# Patient Record
Sex: Female | Born: 1975 | Race: White | Hispanic: No | Marital: Married | Smoking: Current every day smoker
Health system: Southern US, Community
[De-identification: ages and names within clinical notes are randomized; demographics above are authoritative.]

## PROBLEM LIST (undated history)

## (undated) HISTORY — PX: TOOTH EXTRACTION: SUR596

---

## 2018-03-05 ENCOUNTER — Other Ambulatory Visit: Payer: Self-pay

## 2018-03-05 ENCOUNTER — Emergency Department (HOSPITAL_COMMUNITY): Payer: Self-pay

## 2018-03-05 ENCOUNTER — Encounter (HOSPITAL_COMMUNITY): Payer: Self-pay | Admitting: Emergency Medicine

## 2018-03-05 ENCOUNTER — Emergency Department (HOSPITAL_COMMUNITY)
Admission: EM | Admit: 2018-03-05 | Discharge: 2018-03-05 | Disposition: A | Discharge status: Home / Self Care | Payer: Self-pay | Attending: Emergency Medicine | Admitting: Emergency Medicine

## 2018-03-05 DIAGNOSIS — Y939 Activity, unspecified: Secondary | ICD-10-CM | POA: Insufficient documentation

## 2018-03-05 DIAGNOSIS — W19XXXA Unspecified fall, initial encounter: Secondary | ICD-10-CM | POA: Insufficient documentation

## 2018-03-05 DIAGNOSIS — S82831A Other fracture of upper and lower end of right fibula, initial encounter for closed fracture: Secondary | ICD-10-CM | POA: Insufficient documentation

## 2018-03-05 DIAGNOSIS — Y999 Unspecified external cause status: Secondary | ICD-10-CM | POA: Insufficient documentation

## 2018-03-05 DIAGNOSIS — F17210 Nicotine dependence, cigarettes, uncomplicated: Secondary | ICD-10-CM | POA: Insufficient documentation

## 2018-03-05 DIAGNOSIS — S82301A Unspecified fracture of lower end of right tibia, initial encounter for closed fracture: Secondary | ICD-10-CM | POA: Insufficient documentation

## 2018-03-05 DIAGNOSIS — Y92019 Unspecified place in single-family (private) house as the place of occurrence of the external cause: Secondary | ICD-10-CM | POA: Insufficient documentation

## 2018-03-05 MED ORDER — OXYCODONE-ACETAMINOPHEN 5-325 MG PO TABS
1.0000 | ORAL_TABLET | Freq: Once | ORAL | Status: AC
  Administered 2018-03-05: 1 via ORAL
  Filled 2018-03-05: qty 1

## 2018-03-05 MED ORDER — OXYCODONE-ACETAMINOPHEN 5-325 MG PO TABS: 1.0000 | ORAL_TABLET | ORAL | 0 refills | Status: DC | PRN

## 2018-03-05 NOTE — ED Provider Notes (Signed)
[facility]ENCY [facility]PARTMENT Provider Note   CSN: [id number] Arrival date & time: 03/05/18  1413     History   Chief Complaint Chief Complaint  Patient presents with  . Ankle Pain    Right    HPI Julienne KassChristina Vandall is a 42 y.o. female.  The history is provided by the patient. No language interpreter was used.  Ankle Pain   The incident occurred less than 1 hour ago. The incident occurred at home. The injury mechanism was a fall. The pain is present in the right ankle. The quality of the pain is described as sharp and aching. The pain is moderate. The pain has been constant since onset. Associated symptoms include inability to bear weight. She reports no foreign bodies present. She has tried nothing for the symptoms. The treatment provided no relief.    History reviewed. No pertinent past medical history.  There are no active problems to display for this patient.   History reviewed. No pertinent surgical history.   OB History   None      Home Medications    Prior to Admission medications   Not on File    Family History History reviewed. No pertinent family history.  Social History Social History   Tobacco Use  . Smoking status: Current Every Day Smoker    Packs/day: 0.50  . Smokeless tobacco: Never Used  Substance Use Topics  . Alcohol use: Yes    Comment: couple times a week  . Drug use: Not Currently     Allergies   Patient has no known allergies.   Review of Systems Review of Systems  Musculoskeletal: Positive for joint swelling.  All other systems reviewed and are negative.    Physical Exam Updated Vital Signs BP (!) 114/57 (BP Location: Right Arm)   Pulse (!) 113   Temp 98.4 F (36.9 C) (Oral)   Resp 18   Ht 5\' 3"  (1.6 m)   Wt 65.8 kg   LMP 02/13/2018   SpO2 95%   BMI 25.69 kg/m   Physical Exam  Constitutional: She appears well-developed and well-nourished.  HENT:  Head: Normocephalic.  Musculoskeletal: She exhibits  tenderness.  Swollen right ankle  Tender medial and lateral aspect  nv ns intact   Neurological: She is alert.  Skin: Skin is warm.  Psychiatric: She has a normal mood and affect.  Nursing note and vitals reviewed.    ED Treatments / Results  Labs (all labs ordered are listed, but only abnormal results are displayed) Labs Reviewed - No data to display  EKG None  Radiology Dg Ankle Complete Right  Result Date: 03/05/2018 CLINICAL DATA:  Pain after fall EXAM: RIGHT ANKLE - COMPLETE 3+ VIEW COMPARISON:  None. FINDINGS: There is a mildly displaced fracture in the distal fibular diaphysis. There is a fracture in the distal tibia as well involving the posterior malleolus on the lateral view. A lucency through the medial malleolus appears to be more chronic. There is widening of the ankle mortise medially. IMPRESSION: 1. Fracture through the distal fibula. 2. Fracture through the tibia located posteriorly and medially, extending through the posterior malleolus. A lucency through the medial malleolus appears nonacute. 3. Widening of the ankle mortise, particularly medially. Electronically Signed   By: Gerome Samavid  Williams III M.D   On: 03/05/2018 15:13    Procedures Procedures (including critical care time)  Medications Ordered in ED Medications - No data to display   Initial Impression / Assessment and Plan / ED  Course  I have reviewed the triage vital signs and the nursing notes.  Pertinent labs & imaging results that were available during my care of the patient were reviewed by me and considered in my medical decision making (see chart for details).    Xray shows fracture fibula and tibia,   Pt counseled on need to see Dr. Romeo Apple for evaluation.  Pt placed in a splint,  I advised ice,  Rx for Hydrocodone   Final Clinical Impressions(s) / ED Diagnoses   Final diagnoses:  Fracture of distal end of tibia with fibula, right, closed, initial encounter    ED Discharge Orders          Ordered    oxyCODONE-acetaminophen (PERCOCET/ROXICET) 5-325 MG tablet  Every 4 hours PRN     03/05/18 1615        An After Visit Summary was printed and given to the patient.    Osie Cheeks 03/05/18 1616    Donnetta Hutching, MD 03/06/18 [phone]

## 2018-03-05 NOTE — ED Triage Notes (Signed)
Pt reports tripping over dog toys and her ankle went the opposite way. Has ace bandage and crutches with her at this time.

## 2018-03-08 ENCOUNTER — Ambulatory Visit: Payer: Commercial Managed Care - PPO | Admitting: Orthopedic Surgery

## 2018-03-08 ENCOUNTER — Encounter: Payer: Self-pay | Admitting: Orthopedic Surgery

## 2018-03-08 VITALS — BP 135/74 | HR 120 | Ht 63.0 in

## 2018-03-08 DIAGNOSIS — S82899A Other fracture of unspecified lower leg, initial encounter for closed fracture: Secondary | ICD-10-CM

## 2018-03-08 DIAGNOSIS — S82891A Other fracture of right lower leg, initial encounter for closed fracture: Secondary | ICD-10-CM

## 2018-03-08 NOTE — Patient Instructions (Addendum)
Go to [facility] Friday Aug 30th for the CT scan at 7pm, arrive at [facility]6:45

## 2018-03-08 NOTE — Progress Notes (Signed)
  NEW PATIENT OFFICE VISI  Chief Complaint  Patient presents with  . Ankle Injury    rigth ankle injury 03/05/18 tripped over dog     42 year old female healthy fell over some dog twice injured her right ankle.  She was seen in the emergency room complaining of pain and swelling of the right ankle and she could not bear weight.  She had an x-ray which shows a fibular fracture and a posterior malleolar fracture with a disrupted ankle mortise.  Her splint is causing her some discomfort she is having some heel pain.  The pain is resolving somewhat and the splint the splint seems to be causing more pain than the fracture  The pain is now moderate if not mild she still cannot bear weight  Date of injury was August 25   Review of Systems  All other systems reviewed and are negative.    History reviewed. No pertinent past medical history. She says she does not have hypertension or diabetes History reviewed. No pertinent surgical history. Never had surgery History reviewed. No pertinent family history.  No anesthetic problems and no bleeding problems Social History   Tobacco Use  . Smoking status: Current Every Day Smoker    Packs/day: 0.50  . Smokeless tobacco: Never Used  Substance Use Topics  . Alcohol use: Yes    Comment: couple times a week  . Drug use: Not Currently    No Known Allergies  No outpatient medications have been marked as taking for the 03/08/18 encounter (Office Visit) with Vickki HearingHarrison, Faithe Ariola E, MD.    BP 135/74   Pulse (!) 120   Ht 5\' 3"  (1.6 m)   LMP 02/13/2018   BMI 25.69 kg/m   Physical Exam  Constitutional: She is oriented to person, place, and time. She appears well-developed and well-nourished.  Neurological: She is alert and oriented to person, place, and time.  Psychiatric: She has a normal mood and affect. Judgment normal.  Vitals reviewed.   Ortho Exam Left ankle inspection  Normal range of motion normal stability normal muscle tone  strength normal skin normal pulse normal sensation normal  Gait unable to bear weight on the injured limb walks with a supportive device weightbearing on the opposite limb  On the right side we see pain and swelling and tenderness in the medial and lateral side of the ankle she has limited range of motion of the foot does not quite get up to neutral dorsiflexion.  Stability of the ankle is by x-ray unstable muscle tone is normal skin is ecchymotic lateral medial posterior pulse and temperature normal color normal sensation normal   MEDICAL DECISION SECTION  X[facility] were [facility] at [facility]  My independent reading of xrays:  Lateral malleolus fracture supination external rotation type injury large posterior malleolar fragment no obvious medial malleolar fragment, the posterior malleolus seems to be an avulsion type fracture which actually may have more instability associated with it  Encounter Diagnosis  Name Primary?  . Other fracture of unspecified lower leg, initial encounter for closed fracture     PLAN: (Rx., injectx, surgery, frx, mri/ct) Re-SPLINT  CT scan patient would like a knee walker  FU AFTER Ct  SURGERY WILL BE NEEDED   No orders of the defined types were placed in this encounter.   Fuller CanadaStanley Giulia Hickey, MD  03/08/2018 3:12 PM

## 2018-03-10 ENCOUNTER — Ambulatory Visit (HOSPITAL_COMMUNITY)
Admission: RE | Admit: 2018-03-10 | Discharge: 2018-03-10 | Disposition: A | Discharge status: Home / Self Care | Payer: Self-pay | Source: Ambulatory Visit | Attending: Orthopedic Surgery | Admitting: Orthopedic Surgery

## 2018-03-10 DIAGNOSIS — X58XXXA Exposure to other specified factors, initial encounter: Secondary | ICD-10-CM | POA: Insufficient documentation

## 2018-03-10 DIAGNOSIS — S82899A Other fracture of unspecified lower leg, initial encounter for closed fracture: Secondary | ICD-10-CM

## 2018-03-10 DIAGNOSIS — S82851A Displaced trimalleolar fracture of right lower leg, initial encounter for closed fracture: Secondary | ICD-10-CM | POA: Insufficient documentation

## 2018-03-15 ENCOUNTER — Ambulatory Visit: Payer: Commercial Managed Care - PPO | Admitting: Orthopedic Surgery

## 2018-03-15 ENCOUNTER — Encounter: Payer: Self-pay | Admitting: Orthopedic Surgery

## 2018-03-15 VITALS — BP 112/78 | HR 114 | Ht 63.0 in

## 2018-03-15 DIAGNOSIS — S82891D Other fracture of right lower leg, subsequent encounter for closed fracture with routine healing: Secondary | ICD-10-CM

## 2018-03-15 NOTE — Progress Notes (Signed)
Chief Complaint  Patient presents with  . Ankle Pain    right ankle fracture 03/05/18; right ankle pain   . Results    review CT scan    History 42 year old female injured on August 25 sustained a trimalleolar fracture right ankle.  Patient sent for CT scan to further evaluate fracture and help manage surgical plan  CT scan comes back trimalleolar fracture  She will need circumferential fixation with 3 plates she will need supine and prone positioning for fracture fixation  There is a vertical fracture line on the medial side which needs buttress plating there is a posterior avulsion fracture which is indication of fracture dislocation of the ankle which will need buttress plating as well as the fibula which needs plating.  The patient has only mild discomfort more of a dull ache.  9 days of discomfort cannot walk on the fracture denies numbness or tingling  Review of Systems  Constitutional: Negative for chills and fever.  Neurological: Negative for tingling.  All other systems reviewed and are negative.   History reviewed. No pertinent past medical history. History reviewed. No pertinent surgical history. Family History  Problem Relation Age of Onset  . Cancer Mother   . Epilepsy Father   . Cancer Maternal Grandmother    Social History   Tobacco Use  . Smoking status: Current Every Day Smoker    Packs/day: 0.50  . Smokeless tobacco: Never Used  Substance Use Topics  . Alcohol use: Yes    Comment: couple times a week  . Drug use: Not Currently   BP 112/78   Pulse (!) 114   Ht 5\' 3"  (1.6 m)   LMP 02/13/2018   BMI 25.69 kg/m  Physical Exam  Constitutional: She is oriented to person, place, and time. She appears well-developed and well-nourished.  Neurological: She is alert and oriented to person, place, and time.  Psychiatric: She has a normal mood and affect. Judgment normal.  Vitals reviewed.  Left lower extremity no tenderness or malalignment range of motion is  full no instability muscle tone normal neurovascular exam is intact skin normal  Right lower extremity only toes are visible patient in splint does have good capillary refill normal color normal sensation.  She can move her knee and hip normally no instability there.  No palpable tenderness through the splint at this time.  Ankle range of motion limited by splint knee and hip range of motion normal knee and hip stable neurovascular exam otherwise normal in the right lower extremity  Upper extremities no tenderness range of motion normal no instability muscle tone normal neurovascular exam is intact skin is normal  Encounter Diagnosis  Name Primary?  . Closed fracture of right ankle with routine healing, subsequent encounter Yes    Plan ORIF right ankle circumferential fixation start supine interim patient prone  Acumed instruments needed   follow-up 11 for splint change and skin check Patient advised of preop findings risks and benefits explained patient accepts  Patient will follow-up 1 day prior to surgery for skin check surgery plan for September 12

## 2018-03-17 NOTE — Patient Instructions (Signed)
Your procedure is scheduled on: 03/23/2018  Report to [facility] at  7:30   AM.  Call this number if you have problems the morning of surgery: [phone]   Remember:   Do not drink or eat food:After Midnight.  :  Take these medicines the morning of surgery with A SIP OF WATER: oxycodone if needed for pain   Do not wear jewelry, make-up or nail polish.  Do not wear lotions, powders, or perfumes. You may wear deodorant.  Do not shave 48 hours prior to surgery. Men may shave face and neck.  Do not bring valuables to the hospital.  Contacts, dentures or bridgework may not be worn into surgery.  Leave suitcase in the car. After surgery it may be brought to your room.  For patients admitted to the hospital, checkout time is 11:00 AM the day of discharge.   Patients discharged the day of surgery will not be allowed to drive home.    Special Instructions: Shower using CHG night before surgery and shower the day of surgery use CHG.  Use special wash - you have one bottle of CHG for all showers.  You should use approximately 1/2 of the bottle for each shower.  Ankle Fracture Treated With ORIF, Care After Refer to this sheet in the next few weeks. These instructions provide you with information about caring for yourself after your procedure. Your health care provider may also give you more specific instructions. Your treatment has been planned according to current medical practices, but problems sometimes occur. Call your health care provider if you have any problems or questions after your procedure. What can I expect after the procedure? After the procedure, it is common to have:  Pain.  Swelling.  Follow these instructions at home: If you have a cast:  Do not stick anything inside the cast to scratch your skin. Doing that increases your risk of infection.  Check the skin around the cast every day. Report any concerns to your health care provider. You may put lotion on dry skin around  the edges of the cast. Do not apply lotion to the skin underneath the cast.  Do not put pressure on any part of the cast until it is fully hardened. This may take several hours.  Keep the cast clean and dry. If you have a splint or boot:  Wear it as directed by your health care provider. Remove it only as directed by your health care provider.  Loosen the splint or boot if your toes become numb and tingle, or if they turn cold and blue.  Do not pressure on any part of the splint or boot until it is fully hardened. This may take several hours.  Keep the splint or boot clean and dry. Bathing  Do not take baths, swim, or use a hot tub until your health care provider approves. Ask your health care provider if you can take showers. You may only be allowed to take sponge baths for bathing.  If your health care provider approves bathing and showering, cover the cast or splint with a watertight plastic bag to protect it from water. Do not let the cast or splint get wet.  Keep the bandage (dressing) dry until your health care provider says it can be removed. Incision care  There are many ways to close and cover an incision. For example, an incision can be closed with stitches (sutures), skin glue, or adhesive strips. Follow instructions from your health care provider  about: ? How to take care of your incision. ? When and how you should change your bandage (dressing). ? When you should remove your dressing. ? Removing whatever was used to close your incision.  Check your incision area every day for signs of infection. Watch for: ? Redness, swelling, or pain. ? Fluid, blood, or pus. Managing pain, stiffness, and swelling  Move your toes often to avoid stiffness and to lessen swelling.  Raise (elevate) the injured area above the level of your heart while you are sitting or lying down.  If directed, apply ice to the injured area (if you have a splint or a boot, not a cast). ? Put ice in a  plastic bag. ? Place a towel between your skin and the bag. ? Leave the ice on for 20 minutes, 2-3 times per day. Driving  Do not drive or operate heavy machinery while taking pain medicine.  Do not drive while wearing a cast, splint, or boot on a foot that you use for driving. Activity  Return to your normal activities as directed by your health care provider. Ask your health care provider what activities are safe for you.  Perform exercises daily as directed by your health care provider or physical therapist. Safety  Do not walk on the injured ankle and do not use it to support your body weight until your health care provider says that you can. Follow these instructions exactly to prevent problems. Use crutches or other supportive devices as directed by your health care provider. General instructions  Do not use any tobacco products, including cigarettes, chewing tobacco, or e-cigarettes. Tobacco can delay bone healing. If you need help quitting, ask your health care provider.  Take medicines only as directed by your health care provider.  Keep all follow-up visits as directed by your health care provider. This is important. Contact a health care provider if:  You have a fever.  Your pain medicine is not helping.  You have redness, swelling, or pain at the site of your incision.  You have fluid, blood, or pus coming from your incision or seeping through your cast.  You notice a bad smell coming from your incision or your dressing. Get help right away if:  You have chest pain.  You have difficulty breathing.  You have numbness or tingling in your foot or leg.  Your foot becomes cold, pale, or blue. This information is not intended to replace advice given to you by your health care provider. Make sure you discuss any questions you have with your health care provider. Document Released: 01/15/2005 Document Revised: 12/04/2015 Document Reviewed: 05/22/2014 Elsevier  Interactive Patient Education  2018 [employer]. General Anesthesia, Adult, Care After These instructions provide you with information about caring for yourself after your procedure. Your health care provider may also give you more specific instructions. Your treatment has been planned according to current medical practices, but problems sometimes occur. Call your health care provider if you have any problems or questions after your procedure. What can I expect after the procedure? After the procedure, it is common to have:  Vomiting.  A sore throat.  Mental slowness.  It is common to feel:  Nauseous.  Cold or shivery.  Sleepy.  Tired.  Sore or achy, even in parts of your body where you did not have surgery.  Follow these instructions at home: For at least 24 hours after the procedure:  Do not: ? Participate in activities where you could fall or  become injured. ? Drive. ? Use heavy machinery. ? Drink alcohol. ? Take sleeping pills or medicines that cause drowsiness. ? Make important decisions or sign legal documents. ? Take care of children on your own.  Rest. Eating and drinking  If you vomit, drink water, juice, or soup when you can drink without vomiting.  Drink enough fluid to keep your urine clear or pale yellow.  Make sure you have little or no nausea before eating solid foods.  Follow the diet recommended by your health care provider. General instructions  Have a responsible adult stay with you until you are awake and alert.  Return to your normal activities as told by your health care provider. Ask your health care provider what activities are safe for you.  Take over-the-counter and prescription medicines only as told by your health care provider.  If you smoke, do not smoke without supervision.  Keep all follow-up visits as told by your health care provider. This is important. Contact a health care provider if:  You continue to have nausea or  vomiting at home, and medicines are not helpful.  You cannot drink fluids or start eating again.  You cannot urinate after 8-12 hours.  You develop a skin rash.  You have fever.  You have increasing redness at the site of your procedure. Get help right away if:  You have difficulty breathing.  You have chest pain.  You have unexpected bleeding.  You feel that you are having a life-threatening or urgent problem. This information is not intended to replace advice given to you by your health care provider. Make sure you discuss any questions you have with your health care provider. Document Released: 10/04/2000 Document Revised: 12/01/2015 Document Reviewed: 06/12/2015 Elsevier Interactive Patient Education  [employer].

## 2018-03-20 ENCOUNTER — Telehealth: Payer: Self-pay | Admitting: Radiology

## 2018-03-20 DIAGNOSIS — S82851A Displaced trimalleolar fracture of right lower leg, initial encounter for closed fracture: Secondary | ICD-10-CM | POA: Insufficient documentation

## 2018-03-20 NOTE — Telephone Encounter (Signed)
I have called to authorization of the ankle surgery / not needed call reference Newman Pies (his name, very heavy accent) and todays date.

## 2018-03-21 ENCOUNTER — Encounter (HOSPITAL_COMMUNITY)
Admission: RE | Admit: 2018-03-21 | Discharge: 2018-03-21 | Disposition: A | Discharge status: Home / Self Care | Payer: Commercial Managed Care - PPO | Source: Ambulatory Visit | Attending: Orthopedic Surgery | Admitting: Orthopedic Surgery

## 2018-03-21 ENCOUNTER — Other Ambulatory Visit: Payer: Self-pay

## 2018-03-21 ENCOUNTER — Encounter (HOSPITAL_COMMUNITY): Payer: Self-pay

## 2018-03-21 DIAGNOSIS — S82851A Displaced trimalleolar fracture of right lower leg, initial encounter for closed fracture: Secondary | ICD-10-CM | POA: Diagnosis present

## 2018-03-21 DIAGNOSIS — F17210 Nicotine dependence, cigarettes, uncomplicated: Secondary | ICD-10-CM | POA: Diagnosis not present

## 2018-03-21 DIAGNOSIS — W19XXXA Unspecified fall, initial encounter: Secondary | ICD-10-CM | POA: Diagnosis not present

## 2018-03-21 DIAGNOSIS — Z01812 Encounter for preprocedural laboratory examination: Secondary | ICD-10-CM | POA: Insufficient documentation

## 2018-03-21 DIAGNOSIS — S8261XA Displaced fracture of lateral malleolus of right fibula, initial encounter for closed fracture: Secondary | ICD-10-CM | POA: Diagnosis not present

## 2018-03-21 LAB — BASIC METABOLIC PANEL
ANION GAP: 9 (ref 5–15)
BUN: 9 mg/dL (ref 6–20)
CALCIUM: 8.8 mg/dL — AB (ref 8.9–10.3)
CO2: 22 mmol/L (ref 22–32)
CREATININE: 0.76 mg/dL (ref 0.44–1.00)
Chloride: 106 mmol/L (ref 98–111)
GFR calc Af Amer: >60 mL/min (ref >60)
GLUCOSE: 105 mg/dL — AB (ref 70–99)
Potassium: 3.8 mmol/L (ref 3.5–5.1)
Sodium: 137 mmol/L (ref 135–145)

## 2018-03-21 LAB — CBC
HCT: 42.6 % (ref 36.0–46.0)
Hemoglobin: 14.2 g/dL (ref 12.0–15.0)
MCH: 28.6 pg (ref 26.0–34.0)
MCHC: 33.3 g/dL (ref 30.0–36.0)
MCV: 85.7 fL (ref 78.0–100.0)
PLATELETS: 396 10*3/uL (ref 150–400)
RBC: 4.97 MIL/uL (ref 3.87–5.11)
RDW: 14.7 % (ref 11.5–15.5)
WBC: 10.6 10*3/uL — ABNORMAL HIGH (ref 4.0–10.5)

## 2018-03-21 LAB — HCG, SERUM, QUALITATIVE: Preg, Serum: NEGATIVE

## 2018-03-22 ENCOUNTER — Ambulatory Visit: Payer: Commercial Managed Care - PPO | Admitting: Orthopedic Surgery

## 2018-03-22 ENCOUNTER — Encounter: Payer: Self-pay | Admitting: Orthopedic Surgery

## 2018-03-22 DIAGNOSIS — S82851D Displaced trimalleolar fracture of right lower leg, subsequent encounter for closed fracture with routine healing: Secondary | ICD-10-CM

## 2018-03-22 NOTE — Progress Notes (Signed)
Chief Complaint  Patient presents with  . Ankle Injury    right ankle injury surgery tomorrow skin check     42 splint change and skin check for surgery tomorrow  Patient scheduled for trimalleolar fracture fixation  Skin looks clean dry and intact  The dorsum of the foot and the medial portion of the foot are numb  New splint applied.  Follow-up 1 week after surgery for splint and dressing change  Encounter Diagnosis  Name Primary?  . Closed displaced trimalleolar fracture of right ankle with routine healing, subsequent encounter 03/05/18

## 2018-03-22 NOTE — H&P (Signed)
  Chief Complaint  Patient presents with  . Ankle Pain      right ankle fracture 03/05/18; right ankle pain   .            History 42 year old female injured on August 25 sustained a trimalleolar fracture right ankle.  Patient sent for CT scan to further evaluate fracture and help manage surgical plan   CT scan comes back trimalleolar fracture   She will need circumferential fixation with 3 plates she will need supine and prone positioning for fracture fixation   There is a vertical fracture line on the medial side which needs buttress plating there is a posterior avulsion fracture which is indication of fracture dislocation of the ankle which will need buttress plating as well as the fibula which needs plating.   The patient has only mild discomfort more of a dull ache.  9 days of discomfort cannot walk on the fracture denies numbness or tingling.  03/22/2018 SKIN CHECKED TODAY AND LOOKED GOOD FOR SURGERY    Review of Systems  Constitutional: Negative for chills and fever.  Neurological: Negative for tingling.  All other systems reviewed and are negative.     History reviewed. No pertinent past medical history. History reviewed. No pertinent surgical history.      Family History  Problem Relation Age of Onset  . Cancer Mother    . Epilepsy Father    . Cancer Maternal Grandmother      Social History         Tobacco Use  . Smoking status: Current Every Day Smoker      Packs/day: 0.50  . Smokeless tobacco: Never Used  Substance Use Topics  . Alcohol use: Yes      Comment: couple times a week  . Drug use: Not Currently    BP 112/78   Pulse (!) 114   Ht 5\' 3"  (1.6 m)   LMP 02/13/2018   BMI 25.69 kg/m  Physical Exam  Constitutional: She is oriented to person, place, and time. She appears well-developed and well-nourished.  Neurological: She is alert and oriented to person, place, and time.  Psychiatric: She has a normal mood and affect. Judgment normal.  Vitals  reviewed.   Left lower extremity no tenderness or malalignment range of motion is full no instability muscle tone normal neurovascular exam is intact skin normal   Right lower extremity only toes are visible patient in splint does have good capillary refill normal color normal sensation.  She can move her knee and hip normally no instability there.  No palpable tenderness through the splint at this time.  Ankle range of motion limited by splint knee and hip range of motion normal knee and hip stable neurovascular exam otherwise normal in the right lower extremity   Upper extremities no tenderness range of motion normal no instability muscle tone normal neurovascular exam is intact skin is normal       Encounter Diagnosis  Name Primary?  . Closed fracture of right ankle with routine healing, subsequent encounter Yes      Plan ORIF right ankle circumferential fixation start supine THEN TURN PRONE patient prone   Acumed instruments needed

## 2018-03-23 ENCOUNTER — Encounter (HOSPITAL_COMMUNITY): Payer: Self-pay | Admitting: *Deleted

## 2018-03-23 ENCOUNTER — Ambulatory Visit (HOSPITAL_COMMUNITY): Payer: Commercial Managed Care - PPO | Admitting: Anesthesiology

## 2018-03-23 ENCOUNTER — Encounter (HOSPITAL_COMMUNITY)
Admission: RE | Disposition: A | Discharge status: Home / Self Care | Payer: Self-pay | Source: Ambulatory Visit | Attending: Orthopedic Surgery

## 2018-03-23 ENCOUNTER — Observation Stay (HOSPITAL_COMMUNITY)
Admission: RE | Admit: 2018-03-23 | Discharge: 2018-03-25 | Disposition: A | Discharge status: Home / Self Care | Payer: Commercial Managed Care - PPO | Source: Ambulatory Visit | Attending: Orthopedic Surgery | Admitting: Orthopedic Surgery

## 2018-03-23 ENCOUNTER — Ambulatory Visit (HOSPITAL_COMMUNITY): Payer: Commercial Managed Care - PPO

## 2018-03-23 ENCOUNTER — Other Ambulatory Visit: Payer: Self-pay

## 2018-03-23 DIAGNOSIS — S82851A Displaced trimalleolar fracture of right lower leg, initial encounter for closed fracture: Secondary | ICD-10-CM | POA: Diagnosis not present

## 2018-03-23 DIAGNOSIS — S82891A Other fracture of right lower leg, initial encounter for closed fracture: Secondary | ICD-10-CM | POA: Diagnosis present

## 2018-03-23 DIAGNOSIS — S8261XA Displaced fracture of lateral malleolus of right fibula, initial encounter for closed fracture: Secondary | ICD-10-CM | POA: Insufficient documentation

## 2018-03-23 DIAGNOSIS — Z8781 Personal history of (healed) traumatic fracture: Secondary | ICD-10-CM

## 2018-03-23 DIAGNOSIS — W19XXXA Unspecified fall, initial encounter: Secondary | ICD-10-CM | POA: Insufficient documentation

## 2018-03-23 DIAGNOSIS — Z01812 Encounter for preprocedural laboratory examination: Secondary | ICD-10-CM | POA: Insufficient documentation

## 2018-03-23 DIAGNOSIS — Z9889 Other specified postprocedural states: Secondary | ICD-10-CM

## 2018-03-23 DIAGNOSIS — F17210 Nicotine dependence, cigarettes, uncomplicated: Secondary | ICD-10-CM | POA: Insufficient documentation

## 2018-03-23 DIAGNOSIS — S82851D Displaced trimalleolar fracture of right lower leg, subsequent encounter for closed fracture with routine healing: Secondary | ICD-10-CM

## 2018-03-23 HISTORY — PX: ORIF ANKLE FRACTURE: SHX5408

## 2018-03-23 SURGERY — OPEN REDUCTION INTERNAL FIXATION (ORIF) ANKLE FRACTURE
Anesthesia: General | Site: Ankle | Laterality: Right

## 2018-03-23 MED ORDER — CHLORHEXIDINE GLUCONATE 4 % EX LIQD: 60.0000 mL | Freq: Once | CUTANEOUS | Status: DC

## 2018-03-23 MED ORDER — DEXAMETHASONE SODIUM PHOSPHATE 4 MG/ML IJ SOLN
INTRAMUSCULAR | Status: AC
  Filled 2018-03-23: qty 1

## 2018-03-23 MED ORDER — HYDROMORPHONE HCL 1 MG/ML IJ SOLN
0.5000 mg | INTRAMUSCULAR | Status: DC | PRN
  Administered 2018-03-23: 1 mg via INTRAVENOUS
  Administered 2018-03-23: 0.5 mg via INTRAVENOUS
  Administered 2018-03-24 – 2018-03-25 (×8): 1 mg via INTRAVENOUS
  Filled 2018-03-23 (×10): qty 1

## 2018-03-23 MED ORDER — ROCURONIUM BROMIDE 100 MG/10ML IV SOLN
INTRAVENOUS | Status: DC | PRN
  Administered 2018-03-23 (×2): 10 mg via INTRAVENOUS
  Administered 2018-03-23: 50 mg via INTRAVENOUS
  Administered 2018-03-23: 10 mg via INTRAVENOUS

## 2018-03-23 MED ORDER — SODIUM CHLORIDE 0.9 % IV SOLN
INTRAVENOUS | Status: DC
  Administered 2018-03-23 – 2018-03-25 (×3): via INTRAVENOUS

## 2018-03-23 MED ORDER — HYDROCODONE-ACETAMINOPHEN 7.5-325 MG PO TABS
1.0000 | ORAL_TABLET | Freq: Once | ORAL | Status: AC
  Administered 2018-03-23: 1 via ORAL

## 2018-03-23 MED ORDER — FENTANYL CITRATE (PF) 100 MCG/2ML IJ SOLN
INTRAMUSCULAR | Status: DC | PRN
  Administered 2018-03-23: 50 ug via INTRAVENOUS
  Administered 2018-03-23: 100 ug via INTRAVENOUS
  Administered 2018-03-23 (×5): 50 ug via INTRAVENOUS
  Administered 2018-03-23: 100 ug via INTRAVENOUS
  Administered 2018-03-23: 50 ug via INTRAVENOUS
  Administered 2018-03-23: 100 ug via INTRAVENOUS
  Administered 2018-03-23 (×2): 50 ug via INTRAVENOUS

## 2018-03-23 MED ORDER — BUPIVACAINE-EPINEPHRINE (PF) 0.5% -1:200000 IJ SOLN
INTRAMUSCULAR | Status: AC
  Filled 2018-03-23: qty 60

## 2018-03-23 MED ORDER — SUGAMMADEX SODIUM 200 MG/2ML IV SOLN
INTRAVENOUS | Status: DC | PRN
  Administered 2018-03-23: 136 mg via INTRAVENOUS

## 2018-03-23 MED ORDER — SUGAMMADEX SODIUM 200 MG/2ML IV SOLN
INTRAVENOUS | Status: AC
  Filled 2018-03-23: qty 2

## 2018-03-23 MED ORDER — ONDANSETRON HCL 4 MG/2ML IJ SOLN
4.0000 mg | Freq: Four times a day (QID) | INTRAMUSCULAR | Status: DC | PRN
  Administered 2018-03-24: 4 mg via INTRAVENOUS
  Filled 2018-03-23: qty 2

## 2018-03-23 MED ORDER — ROCURONIUM BROMIDE 50 MG/5ML IV SOLN
INTRAVENOUS | Status: AC
  Filled 2018-03-23: qty 1

## 2018-03-23 MED ORDER — MIDAZOLAM HCL 5 MG/5ML IJ SOLN
INTRAMUSCULAR | Status: DC | PRN
  Administered 2018-03-23: 2 mg via INTRAVENOUS

## 2018-03-23 MED ORDER — ONDANSETRON HCL 4 MG/2ML IJ SOLN
INTRAMUSCULAR | Status: DC | PRN
  Administered 2018-03-23: 4 mg via INTRAVENOUS

## 2018-03-23 MED ORDER — HYDROMORPHONE HCL 1 MG/ML IJ SOLN
0.5000 mg | INTRAMUSCULAR | Status: DC | PRN
  Administered 2018-03-23 (×2): 0.5 mg via INTRAVENOUS

## 2018-03-23 MED ORDER — ONDANSETRON HCL 4 MG/2ML IJ SOLN
INTRAMUSCULAR | Status: AC
  Filled 2018-03-23: qty 2

## 2018-03-23 MED ORDER — KETOROLAC TROMETHAMINE 30 MG/ML IJ SOLN
30.0000 mg | Freq: Once | INTRAMUSCULAR | Status: AC
  Administered 2018-03-23: 30 mg via INTRAVENOUS

## 2018-03-23 MED ORDER — IBUPROFEN 800 MG PO TABS: 800.0000 mg | ORAL_TABLET | Freq: Three times a day (TID) | ORAL | 1 refills | Status: DC | PRN

## 2018-03-23 MED ORDER — CEFAZOLIN SODIUM-DEXTROSE 2-4 GM/100ML-% IV SOLN
2.0000 g | INTRAVENOUS | Status: AC
  Administered 2018-03-23: 2 g via INTRAVENOUS
  Filled 2018-03-23: qty 100

## 2018-03-23 MED ORDER — OXYCODONE-ACETAMINOPHEN 5-325 MG PO TABS
1.0000 | ORAL_TABLET | ORAL | Status: DC | PRN
  Administered 2018-03-23 – 2018-03-25 (×3): 1 via ORAL
  Filled 2018-03-23 (×3): qty 1

## 2018-03-23 MED ORDER — FENTANYL CITRATE (PF) 250 MCG/5ML IJ SOLN
INTRAMUSCULAR | Status: AC
  Filled 2018-03-23: qty 5

## 2018-03-23 MED ORDER — ONDANSETRON HCL 4 MG/2ML IJ SOLN
4.0000 mg | Freq: Once | INTRAMUSCULAR | Status: AC
  Administered 2018-03-23: 4 mg via INTRAVENOUS

## 2018-03-23 MED ORDER — BUPIVACAINE-EPINEPHRINE (PF) 0.5% -1:200000 IJ SOLN
INTRAMUSCULAR | Status: DC | PRN
  Administered 2018-03-23: 30 mL

## 2018-03-23 MED ORDER — KETOROLAC TROMETHAMINE 30 MG/ML IJ SOLN
INTRAMUSCULAR | Status: AC
  Filled 2018-03-23: qty 1

## 2018-03-23 MED ORDER — LIDOCAINE HCL (CARDIAC) PF 50 MG/5ML IV SOSY
PREFILLED_SYRINGE | INTRAVENOUS | Status: DC | PRN
  Administered 2018-03-23: 50 mg via INTRAVENOUS
  Administered 2018-03-23: 40 mg via INTRAVENOUS

## 2018-03-23 MED ORDER — 0.9 % SODIUM CHLORIDE (POUR BTL) OPTIME
TOPICAL | Status: DC | PRN
  Administered 2018-03-23: 1000 mL

## 2018-03-23 MED ORDER — ACETAMINOPHEN 500 MG PO TABS
ORAL_TABLET | ORAL | Status: AC
  Filled 2018-03-23: qty 1

## 2018-03-23 MED ORDER — FENTANYL CITRATE (PF) 100 MCG/2ML IJ SOLN: 25.0000 ug | INTRAMUSCULAR | Status: DC | PRN

## 2018-03-23 MED ORDER — LIDOCAINE HCL (PF) 1 % IJ SOLN
INTRAMUSCULAR | Status: AC
  Filled 2018-03-23: qty 5

## 2018-03-23 MED ORDER — CEFAZOLIN SODIUM-DEXTROSE 2-4 GM/100ML-% IV SOLN
INTRAVENOUS | Status: AC
  Filled 2018-03-23: qty 100

## 2018-03-23 MED ORDER — HYDROMORPHONE HCL 1 MG/ML IJ SOLN
INTRAMUSCULAR | Status: AC
  Filled 2018-03-23: qty 0.5

## 2018-03-23 MED ORDER — LACTATED RINGERS IV SOLN
INTRAVENOUS | Status: DC
  Administered 2018-03-23 (×2): via INTRAVENOUS

## 2018-03-23 MED ORDER — PROPOFOL 10 MG/ML IV BOLUS
INTRAVENOUS | Status: DC | PRN
  Administered 2018-03-23: 150 mg via INTRAVENOUS

## 2018-03-23 MED ORDER — HYDROCODONE-ACETAMINOPHEN 5-325 MG PO TABS: 1.0000 | ORAL_TABLET | ORAL | 0 refills | Status: DC | PRN

## 2018-03-23 MED ORDER — HYDROCODONE-ACETAMINOPHEN 7.5-325 MG PO TABS: 1.0000 | ORAL_TABLET | Freq: Once | ORAL | Status: DC | PRN

## 2018-03-23 MED ORDER — ACETAMINOPHEN 500 MG PO TABS
500.0000 mg | ORAL_TABLET | Freq: Once | ORAL | Status: AC
  Administered 2018-03-23: 500 mg via ORAL

## 2018-03-23 MED ORDER — DEXAMETHASONE SODIUM PHOSPHATE 4 MG/ML IJ SOLN
INTRAMUSCULAR | Status: DC | PRN
  Administered 2018-03-23: 4 mg via INTRAVENOUS

## 2018-03-23 MED ORDER — HYDROCODONE-ACETAMINOPHEN 7.5-325 MG PO TABS
ORAL_TABLET | ORAL | Status: AC
  Filled 2018-03-23: qty 1

## 2018-03-23 SURGICAL SUPPLY — 55 items
2.7 X 10MM VA LOCKING SCREW (Screw) ×2 IMPLANT
2.7 X 14MM VA LOCKING SCREW (Screw) ×4 IMPLANT
2.7x 34 non locking screw ×4 IMPLANT
3 hole right tibia plate ×2 IMPLANT
3.5 x 10 non locking screw ×6 IMPLANT
3.5 x 30 non locking screw ×2 IMPLANT
3.5 x 36 non locking screw ×2 IMPLANT
BANDAGE ELASTIC 4 LF NS (GAUZE/BANDAGES/DRESSINGS) ×4 IMPLANT
BANDAGE ESMARK 4X12 BL STRL LF (DISPOSABLE) ×1 IMPLANT
BIT DRILL 2 QR W/DEPTH MARKS (BIT) ×2
BIT DRILL 2.8 QR W/DEPTH MARKS (BIT) ×2 IMPLANT
BIT DRILL 2MM QR W/DEPTH MARKS (BIT) ×1 IMPLANT
BLADE SURG SZ10 CARB STEEL (BLADE) ×2 IMPLANT
BNDG COHESIVE 4X5 TAN STRL (GAUZE/BANDAGES/DRESSINGS) ×2 IMPLANT
BNDG ESMARK 4X12 BLUE STRL LF (DISPOSABLE) ×2
CHLORAPREP W/TINT 26ML (MISCELLANEOUS) ×2 IMPLANT
CLOTH BEACON ORANGE TIMEOUT ST (SAFETY) ×2 IMPLANT
COVER LIGHT HANDLE STERIS (MISCELLANEOUS) ×4 IMPLANT
CUFF TOURNIQUET SINGLE 34IN LL (TOURNIQUET CUFF) ×2 IMPLANT
DECANTER SPIKE VIAL GLASS SM (MISCELLANEOUS) ×4 IMPLANT
DRAPE C-ARM FOLDED MOBILE STRL (DRAPES) ×2 IMPLANT
DRAPE PROXIMA HALF (DRAPES) ×2 IMPLANT
GAUZE SPONGE 4X4 12PLY STRL (GAUZE/BANDAGES/DRESSINGS) ×2 IMPLANT
GAUZE XEROFORM 5X9 LF (GAUZE/BANDAGES/DRESSINGS) ×2 IMPLANT
GLOVE BIO SURGEON STRL SZ7 (GLOVE) ×4 IMPLANT
GLOVE BIOGEL M 7.0 STRL (GLOVE) ×4 IMPLANT
GLOVE BIOGEL PI IND STRL 7.0 (GLOVE) ×4 IMPLANT
GLOVE BIOGEL PI INDICATOR 7.0 (GLOVE) ×4
GLOVE SKINSENSE NS SZ8.0 LF (GLOVE) ×1
GLOVE SKINSENSE STRL SZ8.0 LF (GLOVE) ×1 IMPLANT
GLOVE SS N UNI LF 8.5 STRL (GLOVE) ×2 IMPLANT
GOWN STRL REUS W/TWL LRG LVL3 (GOWN DISPOSABLE) ×4 IMPLANT
GOWN STRL REUS W/TWL XL LVL3 (GOWN DISPOSABLE) ×2 IMPLANT
GUIDEWIRE ORTHO MINI ACTK .045 (WIRE) ×8 IMPLANT
INST SET MINOR BONE (KITS) ×2 IMPLANT
KIT TURNOVER KIT A (KITS) ×2 IMPLANT
MANIFOLD NEPTUNE II (INSTRUMENTS) ×2 IMPLANT
NEEDLE HYPO 21X1.5 SAFETY (NEEDLE) ×2 IMPLANT
NS IRRIG 1000ML POUR BTL (IV SOLUTION) ×2 IMPLANT
PACK BASIC LIMB (CUSTOM PROCEDURE TRAY) ×2 IMPLANT
PAD ABD 5X9 TENDERSORB (GAUZE/BANDAGES/DRESSINGS) ×2 IMPLANT
PAD ARMBOARD 7.5X6 YLW CONV (MISCELLANEOUS) ×2 IMPLANT
PAD CAST 4YDX4 CTTN HI CHSV (CAST SUPPLIES) ×2 IMPLANT
PADDING CAST COTTON 4X4 STRL (CAST SUPPLIES) ×2
Plate 5 hole right tibia ×2 IMPLANT
SET BASIN LINEN APH (SET/KITS/TRAYS/PACK) ×2 IMPLANT
SPLINT J IMMOBILIZER 4X20FT (CAST SUPPLIES) ×1 IMPLANT
SPLINT J PLASTER J 4INX20Y (CAST SUPPLIES) ×1
SPONGE LAP 18X18 X RAY DECT (DISPOSABLE) ×2 IMPLANT
STAPLER VISISTAT 35W (STAPLE) ×2 IMPLANT
SUT MON AB 0 CT1 (SUTURE) ×2 IMPLANT
SUT MON AB 2-0 CT1 36 (SUTURE) ×2 IMPLANT
SYR 30ML LL (SYRINGE) ×4 IMPLANT
SYR BULB IRRIGATION 50ML (SYRINGE) ×2 IMPLANT
Screw VA locking 2.7 x 36 ×2 IMPLANT

## 2018-03-23 NOTE — Op Note (Signed)
03/23/2018  12:46 PM  PATIENT:  Bonnie KassChristina Rosselli  42 y.o. female  PRE-OPERATIVE DIAGNOSIS:  trimalleolar ankle fracture right  POST-OPERATIVE DIAGNOSIS:  trimalleolar ankle fracture right  Surgical findings #1 comminuted fracture right fibula #2 posterior malleolar comminuted fracture  Implants Acumed posterior lateral plate with regular nonlocking and locking screws  Acumed posterior fibular plate with nonlocking and locking screws   PROCEDURE:  Procedure(s): OPEN REDUCTION INTERNAL FIXATION (ORIF) RIGHT ANKLE FRACTURE (Right) - [id number] 27823 Posterior plating of the fibula And posterior buttress plating of the tibia  Surgery was done as follows  The patient was identified in the preop area the surgical site was marked in chart was reviewed.  The patient was taken to the operating room where she had Ancef 2 g and she was intubated.  She was placed in the prone position with appropriate padding  After sterile prep and drape limb was exsanguinated with a 4 inch Esmarch tourniquet elevated up to 275 mmHg where it stayed for 1 hour and 53 minutes.  A posterior lateral incision was made over the posterior aspect of the ankle and right leg subcutaneous tissue was divided sural nerve was protected interval between the peroneal tendons and Achilles tendon was divided bluntly.  Further blunt dissection was carried down to the flexor hallucis longus which was elevated from the posterior tibia exposing the fracture site.  Peroneal artery was identified and a branch was coagulated.  The fracture site was opened irrigated debrided and reduced.  We then turned our attention to the lateral side of the peroneal tendons and retracted them medially exposing the fibula which was comminuted  K wires were placed and bone clamps were placed to reduce the fibula.  We then turned our attention back to the posterior malleolar fragment and placed a posterior lateral plate using the C arm to help guide position  of screws in position of plate  The plate was held with K wires and C arm x-ray was obtained showing the plate in good position  Non-lag screws were placed in the superior part of the plate followed by x-ray confirming reduction  A nonlocking screw was then placed distally  We then turned our attention back to the fibula.  The fibula was in 4 pieces making reduction very difficult however, with a combination of x-ray on AP and lateral planes fibula was out to length reduced and a posterior plate was placed for starting distally placing non-lag screws and then reducing the plate and distal fragment to the proximal fragment.  We then placed nonlocking screws proximally.  X-ray showed the screws were long distally so we sequentially took them out and replaced them with locking screws  Final AP and lateral x-ray confirmed mortise reduction reduction of the posterior fragment fibula was out to length  Thorough irrigation was performed wound was closed with 0 Monocryl and staples followed by injection of 30 cc of Marcaine with epinephrine sterile dressings and posterior splint    SURGEON:  Surgeon(s) and Role:    Vickki Hearing* Levonia Wolfley E, MD - Primary  Assisted by [place]antana NationBetty Ashley  General anesthesia prone position   No blood administered  Drains none  30 cc of Marcaine with epinephrine   No specimen   Correct count  EBL:  75 mL   TOURNIQUET:   Total Tourniquet Time Documented: Thigh (Right) - 113 minutes Total: Thigh (Right) - 113 minutes   DICTATION: .Reubin Milanragon Dictation  PLAN OF CARE: Discharge to home after PACU  PATIENT DISPOSITION:  PACU - hemodynamically stable.   Delay start of Pharmacological VTE agent (>24hrs) due to surgical blood loss or risk of bleeding: not applicable

## 2018-03-23 NOTE — Brief Op Note (Signed)
03/23/2018  12:46 PM  PATIENT:  Bonnie Aguilar  42 y.o. female  PRE-OPERATIVE DIAGNOSIS:  trimalleolar ankle fracture right  POST-OPERATIVE DIAGNOSIS:  trimalleolar ankle fracture right  Surgical findings #1 comminuted fracture right fibula #2 posterior malleolar comminuted fracture  Implants Acumed posterior lateral plate with regular nonlocking and locking screws  Acumed posterior fibular plate with nonlocking and locking screws   PROCEDURE:  Procedure(s): OPEN REDUCTION INTERNAL FIXATION (ORIF) RIGHT ANKLE FRACTURE (Right)  Posterior plating of the fibula And posterior buttress plating of the tibia  Surgery was done as follows  The patient was identified in the preop area the surgical site was marked in chart was reviewed.  The patient was taken to the operating room where she had Ancef 2 g and she was intubated.  She was placed in the prone position with appropriate padding  After sterile prep and drape limb was exsanguinated with a 4 inch Esmarch tourniquet elevated up to 275 mmHg where it stayed for 1 hour and 53 minutes.  A posterior lateral incision was made over the posterior aspect of the ankle and right leg subcutaneous tissue was divided sural nerve was protected interval between the peroneal tendons and Achilles tendon was divided bluntly.  Further blunt dissection was carried down to the flexor hallucis longus which was elevated from the posterior tibia exposing the fracture site.  Peroneal artery was identified and a branch was coagulated.  The fracture site was opened irrigated debrided and reduced.  We then turned our attention to the lateral side of the peroneal tendons and retracted them medially exposing the fibula which was comminuted  K wires were placed and bone clamps were placed to reduce the fibula.  We then turned our attention back to the posterior malleolar fragment and placed a posterior lateral plate using the C arm to help guide position of  screws in position of plate  The plate was held with K wires and C arm x-ray was obtained showing the plate in good position  Non-lag screws were placed in the superior part of the plate followed by x-ray confirming reduction  A nonlocking screw was then placed distally  We then turned our attention back to the fibula.  The fibula was in 4 pieces making reduction very difficult however, with a combination of x-ray on AP and lateral planes fibula was out to length reduced and a posterior plate was placed for starting distally placing non-lag screws and then reducing the plate and distal fragment to the proximal fragment.  We then placed nonlocking screws proximally.  X-ray showed the screws were long distally so we sequentially took them out and replaced them with locking screws  Final AP and lateral x-ray confirmed mortise reduction reduction of the posterior fragment fibula was out to length  Thorough irrigation was performed wound was closed with 0 Monocryl and staples followed by injection of 30 cc of Marcaine with epinephrine sterile dressings and posterior splint    SURGEON:  Surgeon(s) and Role:    Vickki Hearing* Lorean Ekstrand E, MD - Primary  Assisted by [place] NationBetty Ashley  General anesthesia prone position   No blood administered  Drains none  30 cc of Marcaine with epinephrine   No specimen   Correct count  EBL:  75 mL   TOURNIQUET:   Total Tourniquet Time Documented: Thigh (Right) - 113 minutes Total: Thigh (Right) - 113 minutes   DICTATION: .Reubin Milanragon Dictation  PLAN OF CARE: Discharge to home after PACU  PATIENT DISPOSITION:  PACU -  hemodynamically stable.   Delay start of Pharmacological VTE agent (>24hrs) due to surgical blood loss or risk of bleeding: not applicable

## 2018-03-23 NOTE — Discharge Instructions (Signed)
Surgical findings  The fractures were noted in the lateral malleolus small bone on the outside of the foot and the posterior malleolus the portion of the ankle in the back of your leg  Replaced 2 plates and multiple screws  Do not place any weight on the foot.  Keep the foot iced and elevated when not walking    General Anesthesia, Adult, Care After These instructions provide you with information about caring for yourself after your procedure. Your health care provider may also give you more specific instructions. Your treatment has been planned according to current medical practices, but problems sometimes occur. Call your health care provider if you have any problems or questions after your procedure. What can I expect after the procedure? After the procedure, it is common to have:  Vomiting.  A sore throat.  Mental slowness.  It is common to feel:  Nauseous.  Cold or shivery.  Sleepy.  Tired.  Sore or achy, even in parts of your body where you did not have surgery.  Follow these instructions at home: For at least 24 hours after the procedure:  Do not: ? Participate in activities where you could fall or become injured. ? Drive. ? Use heavy machinery. ? Drink alcohol. ? Take sleeping pills or medicines that cause drowsiness. ? Make important decisions or sign legal documents. ? Take care of children on your own.  Rest. Eating and drinking  If you vomit, drink water, juice, or soup when you can drink without vomiting.  Drink enough fluid to keep your urine clear or pale yellow.  Make sure you have little or no nausea before eating solid foods.  Follow the diet recommended by your health care provider. General instructions  Have a responsible adult stay with you until you are awake and alert.  Return to your normal activities as told by your health care provider. Ask your health care provider what activities are safe for you.  Take over-the-counter and  prescription medicines only as told by your health care provider.  If you smoke, do not smoke without supervision.  Keep all follow-up visits as told by your health care provider. This is important. Contact a health care provider if:  You continue to have nausea or vomiting at home, and medicines are not helpful.  You cannot drink fluids or start eating again.  You cannot urinate after 8-12 hours.  You develop a skin rash.  You have fever.  You have increasing redness at the site of your procedure. Get help right away if:  You have difficulty breathing.  You have chest pain.  You have unexpected bleeding.  You feel that you are having a life-threatening or urgent problem. This information is not intended to replace advice given to you by your health care provider. Make sure you discuss any questions you have with your health care provider. Document Released: 10/04/2000 Document Revised: 12/01/2015 Document Reviewed: 06/12/2015 Elsevier Interactive Patient Education  [employer]2018 [employer]

## 2018-03-23 NOTE — Interval H&P Note (Signed)
History and Physical Interval Note:  03/23/2018 9:06 AM  Bonnie Aguilar  has presented today for surgery, with the diagnosis of trimalleolar ankle fracture right  The various methods of treatment have been discussed with the patient and family. After consideration of risks, benefits and other options for treatment, the patient has consented to  Procedure(s): OPEN REDUCTION INTERNAL FIXATION (ORIF) ANKLE FRACTURE (Right) as a surgical intervention .  The patient's history has been reviewed, patient examined, no change in status, stable for surgery.  I have reviewed the patient's chart and labs.  Questions were answered to the patient's satisfaction.     Fuller CanadaStanley Maverik Foot

## 2018-03-23 NOTE — Anesthesia Procedure Notes (Signed)
Procedure Name: Intubation Date/Time: 03/23/2018 10:09 AM Performed by: Andree Elk, Wallis Vancott A, CRNA Pre-anesthesia Checklist: Patient identified, Patient being monitored, Timeout performed, Emergency Drugs available and Suction available Patient Re-evaluated:Patient Re-evaluated prior to induction Oxygen Delivery Method: Circle system utilized Preoxygenation: Pre-oxygenation with 100% oxygen Induction Type: IV induction Ventilation: Mask ventilation without difficulty Laryngoscope Size: Mac and 3 Grade View: Grade I Tube type: Oral Tube size: 7.0 mm Number of attempts: 1 Airway Equipment and Method: Stylet Placement Confirmation: ETT inserted through vocal cords under direct vision,  positive ETCO2 and breath sounds checked- equal and bilateral Secured at: 21 cm Tube secured with: Tape Dental Injury: Teeth and Oropharynx as per pre-operative assessment

## 2018-03-23 NOTE — Transfer of Care (Signed)
Immediate Anesthesia Transfer of Care Note  Patient: Bonnie Aguilar  Procedure(s) Performed: OPEN REDUCTION INTERNAL FIXATION (ORIF) RIGHT ANKLE FRACTURE (Right Ankle)  Patient Location: PACU  Anesthesia Type:General  Level of Consciousness: awake, alert , oriented and patient cooperative  Airway & Oxygen Therapy: Patient Spontanous Breathing  Post-op Assessment: Report given to RN and Post -op Vital signs reviewed and stable  Post vital signs: Reviewed and stable  Last Vitals:  Vitals Value Taken Time  BP 118/41 03/23/2018 12:49 PM  Temp 36.7 C 03/23/2018 12:49 PM  Pulse 93 03/23/2018 12:59 PM  Resp 16 03/23/2018 12:59 PM  SpO2 96 % 03/23/2018 12:59 PM  Vitals shown include unvalidated device data.  Last Pain:  Vitals:   03/23/18 1249  TempSrc:   PainSc: 8       Patients Stated Pain Goal: 8 (03/23/18 1249)  Complications: No apparent anesthesia complications

## 2018-03-23 NOTE — Progress Notes (Signed)
Change in care  Status post open treatment internal fixation right ankle from posterior approach  BP (!) 113/57   Pulse 85   Temp 98.1 F (36.7 C)   Resp 19   LMP 03/12/2018 (Approximate)   SpO2 91%   The patient complains of severe pain in her right lower extremity despite adequate pain medications in the PACU  Recommend admission for overnight for pain control

## 2018-03-23 NOTE — Anesthesia Preprocedure Evaluation (Signed)
Anesthesia Evaluation  Patient identified by MRN, date of birth, ID band Patient awake    Reviewed: Allergy & Precautions, NPO status , Patient's Chart, lab work & pertinent test results  Airway Mallampati: I  TM Distance: >3 FB Neck ROM: Full    Dental no notable dental hx. (+) Edentulous Upper, Edentulous Lower   Pulmonary neg pulmonary ROS, Current Smoker,    Pulmonary exam normal breath sounds clear to auscultation       Cardiovascular Exercise Tolerance: Good negative cardio ROS Normal cardiovascular examI Rhythm:Regular Rate:Normal     Neuro/Psych negative neurological ROS  negative psych ROS   GI/Hepatic negative GI ROS, Neg liver ROS,   Endo/Other  negative endocrine ROS  Renal/GU negative Renal ROS  negative genitourinary   Musculoskeletal negative musculoskeletal ROS (+)   Abdominal   Peds negative pediatric ROS (+)  Hematology negative hematology ROS (+)   Anesthesia Other Findings Larey SeatFell about 2 weeks ago -tripped over dog toys -denies LOC/neck issues  or other injuries   Reproductive/Obstetrics negative OB ROS                             Anesthesia Physical Anesthesia Plan  ASA: II  Anesthesia Plan: General   Post-op Pain Management:    Induction: Intravenous  PONV Risk Score and Plan:   Airway Management Planned: Oral ETT  Additional Equipment:   Intra-op Plan:   Post-operative Plan: Extubation in OR  Informed Consent: I have reviewed the patients History and Physical, chart, labs and discussed the procedure including the risks, benefits and alternatives for the proposed anesthesia with the patient or authorized representative who has indicated his/her understanding and acceptance.   Dental advisory given  Plan Discussed with: CRNA  Anesthesia Plan Comments: (Plan GETA -Dr. Rexene EdisonH requesting prone positioning )        Anesthesia Quick Evaluation

## 2018-03-23 NOTE — Anesthesia Postprocedure Evaluation (Signed)
Anesthesia Post Note  Patient: Bonnie Aguilar  Procedure(s) Performed: OPEN REDUCTION INTERNAL FIXATION (ORIF) RIGHT ANKLE FRACTURE (Right Ankle)  Patient location during evaluation: PACU Anesthesia Type: General Level of consciousness: awake and alert and oriented Pain management: pain level controlled Vital Signs Assessment: post-procedure vital signs reviewed and stable Respiratory status: spontaneous breathing Cardiovascular status: stable Postop Assessment: no apparent nausea or vomiting Anesthetic complications: no     Last Vitals:  Vitals:   03/23/18 1315 03/23/18 1330  BP: 113/60 (!) 121/58  Pulse: 92 83  Resp: 12 20  Temp:    SpO2: 98% 96%    Last Pain:  Vitals:   03/23/18 1315  TempSrc:   PainSc: Asleep                 ADAMS, AMY A

## 2018-03-24 DIAGNOSIS — S82851A Displaced trimalleolar fracture of right lower leg, initial encounter for closed fracture: Secondary | ICD-10-CM | POA: Diagnosis not present

## 2018-03-24 MED ORDER — SODIUM CHLORIDE 0.9 % IV BOLUS
500.0000 mL | Freq: Once | INTRAVENOUS | Status: AC
  Administered 2018-03-24: 500 mL via INTRAVENOUS

## 2018-03-24 MED ORDER — ONDANSETRON HCL 4 MG/2ML IJ SOLN
4.0000 mg | Freq: Four times a day (QID) | INTRAMUSCULAR | Status: DC
  Administered 2018-03-24 – 2018-03-25 (×3): 4 mg via INTRAVENOUS
  Filled 2018-03-24 (×3): qty 2

## 2018-03-24 NOTE — Plan of Care (Signed)
  Problem: Acute Rehab PT Goals(only PT should resolve) Goal: Patient Will Transfer Sit To/From Stand 03/24/2018 1420 by Ocie BobWatkins, Eythan Jayne, PT Flowsheets (Taken 03/24/2018 1420) Patient will transfer sit to/from stand: with modified independence 03/24/2018 1420 by Ocie BobWatkins, Teofil Maniaci, PT Outcome: Progressing Goal: Pt Will Transfer Bed To Chair/Chair To Bed 03/24/2018 1420 by Ocie BobWatkins, Kaya Klausing, PT Flowsheets (Taken 03/24/2018 1420) Pt will Transfer Bed to Chair/Chair to Bed: with modified independence 03/24/2018 1420 by Ocie BobWatkins, Clorine Swing, PT Outcome: Progressing Goal: Pt Will Ambulate 03/24/2018 1420 by Ocie BobWatkins, Merrel Crabbe, PT Flowsheets (Taken 03/24/2018 1420) Pt will Ambulate: 25 feet; with min guard assist; with rolling walker; with crutches 03/24/2018 1420 by Ocie BobWatkins, Fathima Bartl, PT Outcome: Progressing   2:20 PM, 03/24/18 Ocie BobJames Shondell Poulson, MPT Physical Therapist with [facility] 336 [phone] office [phone] mobile phone

## 2018-03-24 NOTE — Progress Notes (Signed)
Patient ID: Bonnie Aguilar, female   DOB: 01-Mar-1976, 42 y.o.   MRN: [id number] First postop day patient complains of dizziness and nausea  Pain better controlled than yesterday   BP (!) 91/51   Pulse 93   Temp 98.3 F (36.8 C) (Oral)   Resp 20   Ht 5\' 3"  (1.6 m)   Wt 68 kg   LMP 03/12/2018 (Approximate)   SpO2 96%   BMI 26.56 kg/m   Toes look good cast is intact no complaints of extreme pressure or tightness in the cast  I will give her some fluid 500 cc bolus of saline  I will adjust her medication for nausea to get that under control  Discharge plan in the morning

## 2018-03-24 NOTE — Care Management (Signed)
Per PT pt will be recommended for [facility] PT also. CM discussed with MD and [facility] rep. Pt will received HH PT if MD orders at DC.

## 2018-03-24 NOTE — Care Management Note (Signed)
Case Management Note  Patient Details  Name: Bonnie Aguilar MRN: [id number] Date of Birth: [birth year]  Subjective/Objective:   S/p fixation of closed trimalleolar fracutre of the right ankle. From home, ind with ADL''s. Has insurance and f/u provider. Need 3 in 1 and RW. Pt given list of DME providers. Has chosen [facility].                  Action/Plan: DC home with self care. [facility], who will deliver DME to pt room prior to DC.   Expected Discharge Date:  03/23/18               Expected Discharge Plan:  Home/Self Care  In-House Referral:  NA  Discharge planning Services  CM Consult  Post Acute Care Choice:  Durable Medical Equipment Choice offered to:  Patient  DME Arranged:  3-N-1, Walker rolling DME Agency:  [facility]  Status of Service:  Completed, signed off  Malcolm MetroChildress, Jayde Mcallister Demske, RN 03/24/2018, 12:35 PM

## 2018-03-24 NOTE — Evaluation (Signed)
Physical Therapy Evaluation Patient Details Name: Bonnie Aguilar Fogal MRN: [id number] DOB: Nov 20, 1975 Today's Date: 03/24/2018   History of Present Illness  Bonnie Aguilar Bifulco is a 42 y/o female, s/p ORIF right ankle fracture with the diagnosis of trimalleolar ankle fracture right   Clinical Impression  Patient limited mostly due to c/o dizziness and limited for standing and taking steps with axillary crutches although she stated she was using crutches at home since right ankle fracture.  Patient able to take a few steps with good return for maintaining NWB RLE to transfer to chair and tolerated sitting up after therapy.  Patient will benefit from continued physical therapy in hospital and recommended venue below to increase strength, balance, endurance for safe ADLs and gait.    Follow Up Recommendations Home health PT;Supervision for mobility/OOB    Equipment Recommendations  Rolling walker with 5" wheels;3in1 (PT)    Recommendations for Other Services       Precautions / Restrictions Precautions Precautions: Fall Restrictions Weight Bearing Restrictions: Yes RLE Weight Bearing: Non weight bearing      Mobility  Bed Mobility Overal bed mobility: Modified Independent                Transfers Overall transfer level: Needs assistance Equipment used: Crutches Transfers: Sit to/from [employer] CorporationStand;Stand Pivot Transfers Sit to Stand: Min assist Stand pivot transfers: Min assist;Mod assist       General transfer comment: very unsteady on feet with c/o dizziness  Ambulation/Gait Ambulation/Gait assistance: Mod assist Gait Distance (Feet): 3 Feet Assistive device: Crutches Gait Pattern/deviations: Decreased step length - right;Decreased step length - left;Decreased stride length Gait velocity: slow   General Gait Details: limited to 4-5 unsteady labored steps with fair/poor standing balance mostly due to c/o dizziness  Stairs            Wheelchair Mobility    Modified  Rankin (Stroke Patients Only)       Balance Overall balance assessment: Needs assistance Sitting-balance support: Feet supported;No upper extremity supported Sitting balance-Leahy Scale: Good     Standing balance support: During functional activity;Bilateral upper extremity supported Standing balance-Leahy Scale: Fair Standing balance comment: using crutches with NWB RLE                             Pertinent Vitals/Pain Pain Assessment: 0-10 Pain Score: 7  Pain Location: right ankle, prior to pain medication was 9/10, after receiving pain medication and 40 minutes to 1 hour later pain at 7/10 Pain Descriptors / Indicators: Aching Pain Intervention(s): Limited activity within patient's tolerance;Monitored during session;Premedicated before session    Home Living Family/patient expects to be discharged to:: Private residence Living Arrangements: Spouse/significant other Available Help at Discharge: Family Type of Home: House Home Access: Stairs to enter Entrance Stairs-Rails: Right;Left(to wide to reach both) Secretary/administratorntrance Stairs-Number of Steps: 3-4   Home Equipment: Crutches;Other (comment);Tub bench Additional Comments: has knee scooter    Prior Function Level of Independence: Independent with assistive device(s)         Comments: Independent prior to fracture, since fracture using knee scooter and crutches for household ambulation     Hand Dominance        Extremity/Trunk Assessment   Upper Extremity Assessment Upper Extremity Assessment: Overall WFL for tasks assessed    Lower Extremity Assessment Lower Extremity Assessment: Overall WFL for tasks assessed;RLE deficits/detail RLE Deficits / Details: grossly 4+/5 except right ankle/foot not tested    Cervical / Trunk Assessment  Cervical / Trunk Assessment: Normal  Communication   Communication: No difficulties  Cognition Arousal/Alertness: Awake/alert Behavior During Therapy: WFL for tasks  assessed/performed Overall Cognitive Status: Within Functional Limits for tasks assessed                                        General Comments      Exercises     Assessment/Plan    PT Assessment Patient needs continued PT services  PT Problem List Decreased strength;Decreased activity tolerance;Decreased balance;Decreased range of motion;Decreased mobility       PT Treatment Interventions Gait training;Stair training;Functional mobility training;Therapeutic activities;Therapeutic exercise;Patient/family education    PT Goals (Current goals can be found in the Care Plan section)  Acute Rehab PT Goals Patient Stated Goal: return home with spouse to assist PT Goal Formulation: With patient Time For Goal Achievement: 03/27/18 Potential to Achieve Goals: Good    Frequency Min 5X/week   Barriers to discharge        Co-evaluation               AM-PAC PT "6 Clicks" Daily Activity  Outcome Measure Difficulty turning over in bed (including adjusting bedclothes, sheets and blankets)?: None Difficulty moving from lying on back to sitting on the side of the bed? : None Difficulty sitting down on and standing up from a chair with arms (e.g., wheelchair, bedside commode, etc,.)?: A Little Help needed moving to and from a bed to chair (including a wheelchair)?: A Little Help needed walking in hospital room?: A Lot Help needed climbing 3-5 steps with a railing? : A Lot 6 Click Score: 18    End of Session   Activity Tolerance: Patient limited by fatigue;Patient limited by pain(Patient limited by dizziness) Patient left: in chair;with call bell/phone within reach Nurse Communication: Mobility status PT Visit Diagnosis: Unsteadiness on feet (R26.81);Other abnormalities of gait and mobility (R26.89);Muscle weakness (generalized) (M62.81)    Time: 1610-9604 PT Time Calculation (min) (ACUTE ONLY): 22 min   Charges:   PT Evaluation $PT Eval Moderate  Complexity: 1 Mod PT Treatments $Therapeutic Activity: 8-22 mins        2:18 PM, 03/24/18 Ocie Bob, MPT Physical Therapist with [facility] 336 [phone] office [phone] mobile phone

## 2018-03-25 DIAGNOSIS — S82851A Displaced trimalleolar fracture of right lower leg, initial encounter for closed fracture: Secondary | ICD-10-CM | POA: Diagnosis not present

## 2018-03-25 MED ORDER — ONDANSETRON HCL 4 MG PO TABS: 4.0000 mg | ORAL_TABLET | Freq: Three times a day (TID) | ORAL | 1 refills | Status: DC | PRN

## 2018-03-25 MED ORDER — OXYCODONE-ACETAMINOPHEN 5-325 MG PO TABS: 1.0000 | ORAL_TABLET | ORAL | 0 refills | Status: DC | PRN

## 2018-03-25 NOTE — Discharge Summary (Signed)
Physician Discharge Summary  Patient ID: Bonnie KassChristina Aguilar MRN: [id number] DOB/AGE: Dec 22, 1975 42 y.o.  Admit date: 03/23/2018 Discharge date: 03/25/2018  Admission Diagnoses: Trimalleolar fracture right ankle  Discharge Diagnoses:  Trimalleolar fracture right ankle,  postop nausea  postop dizziness  postop poor pain control  Active Problems:   Closed trimalleolar fracture of right ankle   Closed right ankle fracture   Discharged Condition: good  Hospital Course: Patient came in for surgery on September 12 had a open treatment internal fixation trimalleolar fracture right ankle with Acumed plate and screws from a posterior lateral approach.  She tolerated surgery well from an anesthesia standpoint but postoperatively had severe pain which we could not control in the PACU.  She was admitted for pain control.  On postop day 1 September 13 her pain control improved although not optimal and she started to have dizziness and postop nausea and vomiting.  She was kept an additional day to receive fluids additional pain medication and medicine for nausea.  On postop day 2 September 14 she had better pain control control of her nausea vomiting and dizziness   Discharge Exam: Blood pressure 116/60, pulse 96, temperature 98.2 F (36.8 C), temperature source Oral, resp. rate 20, height 5\' 3"  (1.6 m), weight 68 kg, last menstrual period 03/12/2018, SpO2 97 %.  She is awake and alert she is oriented her mood is pleasant her affect is flat  She is in a splint we can see the toes and dorsum of the foot which have normal color capillary refill and good extension.  Poor flexion secondary to posterior tibial dissection.  Sensation intact  Disposition: Discharge disposition: 01-Home or Self Care       Discharge Instructions    Call MD / Call 911   Complete by:  As directed    If you experience chest pain or shortness of breath, CALL 911 and be transported to the hospital emergency room.  If  you develope a fever above 101 F, pus (white drainage) or increased drainage or redness at the wound, or calf pain, call your surgeon's office.   Call MD / Call 911   Complete by:  As directed    If you experience chest pain or shortness of breath, CALL 911 and be transported to the hospital emergency room.  If you develope a fever above 101 F, pus (white drainage) or increased drainage or redness at the wound, or calf pain, call your surgeon's office.   Constipation Prevention   Complete by:  As directed    Drink plenty of fluids.  Prune juice may be helpful.  You may use a stool softener, such as Colace (over the counter) 100 mg twice a day.  Use MiraLax (over the counter) for constipation as needed.   Constipation Prevention   Complete by:  As directed    Drink plenty of fluids.  Prune juice may be helpful.  You may use a stool softener, such as Colace (over the counter) 100 mg twice a day.  Use MiraLax (over the counter) for constipation as needed.   Diet - low sodium heart healthy   Complete by:  As directed    Diet - low sodium heart healthy   Complete by:  As directed    Discharge instructions   Complete by:  As directed    Do not place any weight on the operative foot it will cause the fracture to move and may require more surgery  Keep the foot iced and elevated  at all times when not walking   Increase activity slowly as tolerated   Complete by:  As directed    Increase activity slowly as tolerated   Complete by:  As directed      Allergies as of 03/25/2018   No Known Allergies     Medication List    STOP taking these medications   naproxen sodium 220 MG tablet Commonly known as:  ALEVE     TAKE these medications   ibuprofen 800 MG tablet Commonly known as:  ADVIL,MOTRIN Take 1 tablet (800 mg total) by mouth every 8 (eight) hours as needed.   ondansetron 4 MG tablet Commonly known as:  ZOFRAN Take 1 tablet (4 mg total) by mouth every 8 (eight) hours as needed for  nausea or vomiting.   oxyCODONE-acetaminophen 5-325 MG tablet Commonly known as:  PERCOCET/ROXICET Take 1 tablet by mouth every 4 (four) hours as needed for up to 7 days for severe pain.            Durable Medical Equipment  (From admission, onward)         Start     Ordered   03/24/18 1057  For home use only DME 3 n 1  Once     03/24/18 1056   03/24/18 1056  For home use only DME Walker rolling  Once    Question:  Patient needs a walker to treat with the following condition  Answer:  Trimalleolar fracture of ankle, closed   03/24/18 1056         Follow-up Information    Vickki Hearing, MD.   Specialties:  Orthopedic Surgery, Radiology Contact information: [address] [place] [address] [phone]        Follow up On 03/31/2018.   Why:  For wound re-check          Signed: Fuller Canada 03/25/2018, 9:41 AM

## 2018-03-25 NOTE — Progress Notes (Signed)
IV removed, 2x2 gauze and paper tape applied to site, patient tolerated well.  Reviewed AVS with patient who verbalized understanding. Patient awaiting husband's arrival to transport home.

## 2018-03-27 ENCOUNTER — Telehealth: Payer: Self-pay | Admitting: Orthopedic Surgery

## 2018-03-27 ENCOUNTER — Encounter (HOSPITAL_COMMUNITY): Payer: Self-pay | Admitting: Orthopedic Surgery

## 2018-03-27 NOTE — Telephone Encounter (Signed)
I spoke to Bonnie Aguilar to give verbal order. They want to work with her on Stairs.

## 2018-03-27 NOTE — Telephone Encounter (Signed)
Call received via voice message from [place], physical therapist, [facility] for verbal orders for therapy - states did her evaluation yesterday, Sunday, 03/26/18: Asking to see patient 3 times per week for 1 week; states may leave a secure voice message at ph# [phone].

## 2018-03-29 ENCOUNTER — Ambulatory Visit: Payer: Commercial Managed Care - PPO | Admitting: Orthopaedic Surgery

## 2018-03-29 DIAGNOSIS — Z8781 Personal history of (healed) traumatic fracture: Principal | ICD-10-CM

## 2018-03-29 DIAGNOSIS — Z9889 Other specified postprocedural states: Secondary | ICD-10-CM | POA: Insufficient documentation

## 2018-03-31 ENCOUNTER — Ambulatory Visit (INDEPENDENT_AMBULATORY_CARE_PROVIDER_SITE_OTHER): Payer: Commercial Managed Care - PPO | Admitting: Orthopedic Surgery

## 2018-03-31 ENCOUNTER — Encounter: Payer: Self-pay | Admitting: Orthopedic Surgery

## 2018-03-31 VITALS — BP 104/63 | HR 106 | Ht 63.0 in

## 2018-03-31 DIAGNOSIS — Z8781 Personal history of (healed) traumatic fracture: Secondary | ICD-10-CM

## 2018-03-31 DIAGNOSIS — Z967 Presence of other bone and tendon implants: Secondary | ICD-10-CM | POA: Diagnosis not present

## 2018-03-31 DIAGNOSIS — Z9889 Other specified postprocedural states: Secondary | ICD-10-CM

## 2018-03-31 DIAGNOSIS — S82851D Displaced trimalleolar fracture of right lower leg, subsequent encounter for closed fracture with routine healing: Secondary | ICD-10-CM

## 2018-03-31 MED ORDER — OXYCODONE-ACETAMINOPHEN 5-325 MG PO TABS: 1.0000 | ORAL_TABLET | ORAL | 0 refills | Status: AC | PRN

## 2018-03-31 NOTE — Progress Notes (Signed)
Chief Complaint  Patient presents with  . Ankle Injury    Right ankle DOS 03/23/18    Postop day 8  Status post trimalleolar fracture fixation through posterior lateral approach  Comes in for routine dressing change  Pain and swelling increased over the last few days On ibuprofen 800 twice a day and Percocet 5 mg every 4 as needed had been on q. 12  Wound looks clean dry and intact mild swelling of the foot foot position is 5 degrees plantarflexion  No signs of infection  Resplinted posterior splint  Return 1 week x-ray, cast.  No weightbearing  Encounter Diagnoses  Name Primary?  . S/P ORIF (open reduction internal fixation) fracture right ankle 03/23/18 Yes  . Closed displaced trimalleolar fracture of right ankle with routine healing, subsequent encounter     Meds ordered this encounter  Medications  . oxyCODONE-acetaminophen (PERCOCET/ROXICET) 5-325 MG tablet    Sig: Take 1 tablet by mouth every 4 (four) hours as needed for up to 7 days for severe pain.    Dispense:  42 tablet    Refill:  0    Change med from norco to percocet

## 2018-04-07 ENCOUNTER — Ambulatory Visit (INDEPENDENT_AMBULATORY_CARE_PROVIDER_SITE_OTHER): Payer: Commercial Managed Care - PPO

## 2018-04-07 ENCOUNTER — Encounter: Payer: Self-pay | Admitting: Orthopedic Surgery

## 2018-04-07 ENCOUNTER — Ambulatory Visit (INDEPENDENT_AMBULATORY_CARE_PROVIDER_SITE_OTHER): Payer: Commercial Managed Care - PPO | Admitting: Orthopedic Surgery

## 2018-04-07 VITALS — BP 107/68 | HR 103 | Ht 63.0 in | Wt 145.0 lb

## 2018-04-07 DIAGNOSIS — Z8781 Personal history of (healed) traumatic fracture: Secondary | ICD-10-CM

## 2018-04-07 DIAGNOSIS — Z967 Presence of other bone and tendon implants: Secondary | ICD-10-CM

## 2018-04-07 DIAGNOSIS — Z9889 Other specified postprocedural states: Secondary | ICD-10-CM

## 2018-04-07 MED ORDER — IBUPROFEN 800 MG PO TABS: 800.0000 mg | ORAL_TABLET | Freq: Three times a day (TID) | ORAL | 1 refills | Status: DC | PRN

## 2018-04-07 NOTE — Patient Instructions (Signed)
Nonweightbearing operative leg  Take 2 Benadryl 25 mg each at night with a pain pill to help you sleep better

## 2018-04-07 NOTE — Progress Notes (Signed)
Chief Complaint  Patient presents with  . Follow-up    Recheck on right ankle, DOS 03-23-18.   Encounter Diagnosis  Name Primary?  . S/P ORIF (open reduction internal fixation) fracture right ankle 03/23/18 Yes   Post operative day #15  Trimalleolar fracture treated with posterior approach lateral plate and posterior plate  Patient complains of inability to sleep at night says she has minimal pain during the day  Her wound looks clean and dry she has a couple of blisters.  We were able to get her foot may be 2 to 3 degrees shy of plantigrade.  Her x-ray shows stable fixation mortise intact on the lateral x-ray she has a small deficit at the articular surface of the posterior joint.  Comparing this to previous x-rays there is been no major change in the fracture alignment so we will continue to treat as is with casting today.  I would like to get her back in the office for casting in 2 weeks to try to get that foot up a little bit more  Recommend to Benadryl and a pain pill at night.  I refilled her ibuprofen today  Meds ordered this encounter  Medications  . ibuprofen (ADVIL,MOTRIN) 800 MG tablet    Sig: Take 1 tablet (800 mg total) by mouth every 8 (eight) hours as needed.    Dispense:  90 tablet    Refill:  1

## 2018-04-14 ENCOUNTER — Telehealth: Payer: Self-pay | Admitting: Orthopedic Surgery

## 2018-04-14 ENCOUNTER — Other Ambulatory Visit: Payer: Self-pay | Admitting: Orthopedic Surgery

## 2018-04-14 MED ORDER — OXYCODONE-ACETAMINOPHEN 5-325 MG PO TABS: 1.0000 | ORAL_TABLET | ORAL | 0 refills | Status: DC | PRN

## 2018-04-14 NOTE — Telephone Encounter (Signed)
She is requesting a refill on pain meds, she was on Oxycodone  Please advise

## 2018-04-14 NOTE — Telephone Encounter (Signed)
Patient called stating that her pharmacy has not received any prescription from our office, but it is showing that they received it on 04/07/18.   Ibuprofen 800 MG  Qty 90 Tablets    Take 1 tablet (800 mg total) by mouth every 8 (eight) hours as needed.  PATIENT USES [facility]

## 2018-04-21 ENCOUNTER — Ambulatory Visit (INDEPENDENT_AMBULATORY_CARE_PROVIDER_SITE_OTHER): Payer: Commercial Managed Care - PPO | Admitting: Orthopedic Surgery

## 2018-04-21 VITALS — BP 131/71 | HR 122 | Ht 63.0 in | Wt 145.0 lb

## 2018-04-21 DIAGNOSIS — Z4789 Encounter for other orthopedic aftercare: Secondary | ICD-10-CM

## 2018-04-21 DIAGNOSIS — Z9889 Other specified postprocedural states: Secondary | ICD-10-CM

## 2018-04-21 DIAGNOSIS — Z8781 Personal history of (healed) traumatic fracture: Secondary | ICD-10-CM

## 2018-04-21 DIAGNOSIS — T8149XA Infection following a procedure, other surgical site, initial encounter: Secondary | ICD-10-CM

## 2018-04-21 MED ORDER — SULFAMETHOXAZOLE-TRIMETHOPRIM 400-80 MG PO TABS: 1.0000 | ORAL_TABLET | Freq: Two times a day (BID) | ORAL | 0 refills | Status: DC

## 2018-04-21 NOTE — Progress Notes (Signed)
POST OP VISIT   Patient ID: Bonnie Aguilar, female   DOB: September 04, 1975, 42 y.o.   MRN: [id number]  Chief Complaint  Patient presents with  . Leg Pain    Right leg pain over site    Encounter Diagnoses  Name Primary?  . S/P ORIF (open reduction internal fixation) fracture right ankle 03/23/18   . Cast discomfort   . Cellulitis, wound, post-operative Yes   POD 29  Pictures in the chart wound pressure from cast patient complains of rubbing last 3 days increased pain  Wound is not open but there is surrounding erythema  Start antibiotics come back week for wound check  CAM WALKER APPLIED   Meds ordered this encounter  Medications  . sulfamethoxazole-trimethoprim (BACTRIM) 400-80 MG tablet    Sig: Take 1 tablet by mouth 2 (two) times daily.    Dispense:  28 tablet    Refill:  0

## 2018-04-28 ENCOUNTER — Ambulatory Visit: Payer: Commercial Managed Care - PPO | Admitting: Orthopedic Surgery

## 2018-04-28 ENCOUNTER — Telehealth: Payer: Self-pay | Admitting: Orthopedic Surgery

## 2018-04-28 NOTE — Telephone Encounter (Signed)
FYI  Spoke with patient and rescheduled her to come in on Monday 10/21. I asked how her ankle was and she stated "much better". Stated she is elevating and changing dressing as she was told also taking her medication as prescribed. Stated the blackness around the edges are gone and there is very little drainage now. I advised her to continue what she was told to do and be sure to come in on Monday.

## 2018-05-01 ENCOUNTER — Encounter: Payer: Self-pay | Admitting: Orthopedic Surgery

## 2018-05-01 ENCOUNTER — Ambulatory Visit (INDEPENDENT_AMBULATORY_CARE_PROVIDER_SITE_OTHER): Payer: Commercial Managed Care - PPO | Admitting: Orthopedic Surgery

## 2018-05-01 VITALS — BP 128/72 | HR 111 | Ht 63.0 in | Wt 145.0 lb

## 2018-05-01 DIAGNOSIS — Z9889 Other specified postprocedural states: Secondary | ICD-10-CM

## 2018-05-01 DIAGNOSIS — Z8781 Personal history of (healed) traumatic fracture: Secondary | ICD-10-CM

## 2018-05-01 DIAGNOSIS — T8149XA Infection following a procedure, other surgical site, initial encounter: Secondary | ICD-10-CM

## 2018-05-01 MED ORDER — SULFAMETHOXAZOLE-TRIMETHOPRIM 400-80 MG PO TABS: 1.0000 | ORAL_TABLET | Freq: Two times a day (BID) | ORAL | 0 refills | Status: DC

## 2018-05-01 NOTE — Patient Instructions (Signed)
Continue antibiotic, use heat on the top of the foot 20 minutes at a time as needed

## 2018-05-01 NOTE — Progress Notes (Signed)
Chief Complaint  Patient presents with  . Leg Problem    Right leg/ankle recheck   42 years old had a open reduction internal fixation of the ankle for trimalleolar ankle fracture posterior approach right ankle  Date of surgery March 23, 2018:  Patient developed cellulitis around the wound edge.  She was treated with Bactrim.  This is day 10  Her wound has improved it is now scabbed over.  She has some surrounding erythema minimal tenderness.  She actually has more tenderness on the top of the foot on the dorsal lateral side tender to palpation no erythema she has excellent ankle motion to moving her toes normal there is no numbness or tingling  We think this may have come from the cast  Continue daily dressing changes Bactrim was refilled follow-up in a week for wound check

## 2018-05-08 ENCOUNTER — Encounter: Payer: Self-pay | Admitting: Orthopedic Surgery

## 2018-05-08 ENCOUNTER — Ambulatory Visit (INDEPENDENT_AMBULATORY_CARE_PROVIDER_SITE_OTHER): Payer: Commercial Managed Care - PPO | Admitting: Orthopedic Surgery

## 2018-05-08 VITALS — BP 122/47 | HR 103 | Ht 63.0 in | Wt 145.0 lb

## 2018-05-08 DIAGNOSIS — Z8781 Personal history of (healed) traumatic fracture: Secondary | ICD-10-CM

## 2018-05-08 DIAGNOSIS — Z9889 Other specified postprocedural states: Secondary | ICD-10-CM

## 2018-05-08 DIAGNOSIS — T8149XA Infection following a procedure, other surgical site, initial encounter: Secondary | ICD-10-CM

## 2018-05-08 MED ORDER — SULFAMETHOXAZOLE-TRIMETHOPRIM 400-80 MG PO TABS: 1.0000 | ORAL_TABLET | Freq: Two times a day (BID) | ORAL | 0 refills | Status: DC

## 2018-05-08 NOTE — Patient Instructions (Signed)
WBAT IN BOOT   RELEASE TO WORK

## 2018-05-08 NOTE — Progress Notes (Signed)
Chief Complaint  Patient presents with  . Post-op Problem    wound has some drainage     Surgery date March 23, 2018 postop day 46  Patient complains of small amount of purulent drainage where the wound opened up.  She is currently on oral antibiotics.  She says her foot no longer hurts except on the dorsal aspect.  The wound looks pretty good there is an open area I was not able to express any purulence there is no surrounding erythema there is no tenderness around the wound she has adequate dorsiflexion plantarflexion of the foot  Recommend continue oral antibiotics  Clean the wound as stated previously  Start weightbearing as tolerated in the boot  Follow-up 4 weeks for x-ray

## 2018-05-09 ENCOUNTER — Encounter: Payer: Self-pay | Admitting: Orthopedic Surgery

## 2018-05-22 ENCOUNTER — Ambulatory Visit (INDEPENDENT_AMBULATORY_CARE_PROVIDER_SITE_OTHER): Payer: Medicaid Other

## 2018-05-22 ENCOUNTER — Encounter: Payer: Self-pay | Admitting: Orthopedic Surgery

## 2018-05-22 ENCOUNTER — Ambulatory Visit (INDEPENDENT_AMBULATORY_CARE_PROVIDER_SITE_OTHER): Payer: Self-pay | Admitting: Orthopedic Surgery

## 2018-05-22 VITALS — BP 111/68 | HR 110 | Ht 63.0 in | Wt 145.0 lb

## 2018-05-22 DIAGNOSIS — Z9889 Other specified postprocedural states: Secondary | ICD-10-CM

## 2018-05-22 DIAGNOSIS — Z8781 Personal history of (healed) traumatic fracture: Secondary | ICD-10-CM

## 2018-05-22 DIAGNOSIS — T8149XA Infection following a procedure, other surgical site, initial encounter: Secondary | ICD-10-CM

## 2018-05-22 NOTE — Progress Notes (Signed)
Chief Complaint  Patient presents with  . Routine Post Op    Recheck on right ankle fracture, DOS 03-23-18.    X-rays today  Patient finished course of antibiotics Bactrim DS 14 days  Still having drainage and tenderness and erythema proximally  Able to walk with CAM Walker weightbearing with crutches  I recommend that she go in for incision drainage.  X-ray today shows fracture looks healed  Follow-up will be on the 18th postop visit

## 2018-05-22 NOTE — Patient Instructions (Signed)
Bonnie Aguilar  05/22/2018     @PREFPERIOPPHARMACY @   Your procedure is scheduled on 05/25/2018.  Report to [facility] at  1200   P.M.  Call this number if you have problems the morning of surgery:  [phone]   Remember:  Do not eat or drink after midnight.                          Take these medicines the morning of surgery with A SIP OF WATER  None    Do not wear jewelry, make-up or nail polish.  Do not wear lotions, powders, or perfumes, or deodorant.  Do not shave 48 hours prior to surgery.  Men may shave face and neck.  Do not bring valuables to the hospital.  [facility] is not responsible for any belongings or valuables.  Contacts, dentures or bridgework may not be worn into surgery.  Leave your suitcase in the car.  After surgery it may be brought to your room.  For patients admitted to the hospital, discharge time will be determined by your treatment team.  Patients discharged the day of surgery will not be allowed to drive home.   Name and phone number of your driver:   family Special instructions:  None  Please read over the following fact sheets that you were given. Anesthesia Post-op Instructions and Care and Recovery After Surgery       Incision and Drainage Incision and drainage is a surgical procedure to open and drain a fluid-filled sac. The sac may be filled with pus, mucus, or blood. Examples of fluid-filled sacs that may need surgical drainage include cysts, skin infections (abscesses), and red lumps that develop from a ruptured cyst or a small abscess (boils). You may need this procedure if the affected area is large, painful, infected, or not healing well. Tell a health care provider about:  Any allergies you have.  All medicines you are taking, including vitamins, herbs, eye drops, creams, and over-the-counter medicines.  Any problems you or family members have had with anesthetic medicines.  Any blood disorders you  have.  Any surgeries you have had.  Any medical conditions you have.  Whether you are pregnant or may be pregnant. What are the risks? Generally, this is a safe procedure. However, problems may occur, including:  Infection.  Bleeding.  Allergic reactions to medicines.  Scarring.  What happens before the procedure?  You may need an ultrasound or other imaging tests to see how large or deep the fluid-filled sac is.  You may have blood tests to check for infection.  You may get a tetanus shot.  You may be given antibiotic medicine to help prevent infection.  Follow instructions from your health care provider about eating or drinking restrictions.  Ask your health care provider about: ? Changing or stopping your regular medicines. This is especially important if you are taking diabetes medicines or blood thinners. ? Taking medicines such as aspirin and ibuprofen. These medicines can thin your blood. Do not take these medicines before your procedure if your health care provider instructs you not to.  Plan to have someone take you home after the procedure.  If you will be going home right after the procedure, plan to have someone stay with you for 24 hours. What happens during the procedure?  To reduce your risk of infection: ? Your health care team will wash  or sanitize their hands. ? Your skin will be washed with soap.  You will be given one or more of the following: ? A medicine to help you relax (sedative). ? A medicine to numb the area (local anesthetic). ? A medicine to make you fall asleep (general anesthetic).  An incision will be made in the top of the fluid-filled sac.  The contents of the sac may be squeezed out, or a syringe or tube (catheter)may be used to empty the sac.  The catheter may be left in place for several weeks to drain any fluid. Or, your health care provider may stitch open the edges of the incision to make a long-term opening for drainage  (marsupialization).  The inside of the sac may be washed out (irrigated) with a sterile solution and packed with gauze before it is covered with a bandage (dressing). The procedure may vary among health care providers and hospitals. What happens after the procedure?  Your blood pressure, heart rate, breathing rate, and blood oxygen level will be monitored often until the medicines you were given have worn off.  Do not drive for 24 hours if you received a sedative. This information is not intended to replace advice given to you by your health care provider. Make sure you discuss any questions you have with your health care provider. Document Released: 12/22/2000 Document Revised: 12/04/2015 Document Reviewed: 04/18/2015 Elsevier Interactive Patient Education  2018 [employer] Inc.  Incision and Drainage, Care After Refer to this sheet in the next few weeks. These instructions provide you with information about caring for yourself after your procedure. Your health care provider may also give you more specific instructions. Your treatment has been planned according to current medical practices, but problems sometimes occur. Call your health care provider if you have any problems or questions after your procedure. What can I expect after the procedure? After the procedure, it is common to have:  Pain or discomfort around your incision site.  Drainage from your incision.  Follow these instructions at home:  Take over-the-counter and prescription medicines only as told by your health care provider.  If you were prescribed an antibiotic medicine, take it as told by your health care provider.Do not stop taking the antibiotic even if you start to feel better.  Followinstructions from your health care provider about: ? How to take care of your incision. ? When and how you should change your packing and bandage (dressing). Wash your hands with soap and water before you change your dressing. If  soap and water are not available, use hand sanitizer. ? When you should remove your dressing.  Do not take baths, swim, or use a hot tub until your health care provider approves.  Keep all follow-up visits as told by your health care provider. This is important.  Check your incision area every day for signs of infection. Check for: ? More redness, swelling, or pain. ? More fluid or blood. ? Warmth. ? Pus or a bad smell. Contact a health care provider if:  Your cyst or abscess returns.  You have a fever.  You have more redness, swelling, or pain around your incision.  You have more fluid or blood coming from your incision.  Your incision feels warm to the touch.  You have pus or a bad smell coming from your incision. Get help right away if:  You have severe pain or bleeding.  You cannot eat or drink without vomiting.  You have decreased urine output.  You become short of breath.  You have chest pain.  You cough up blood.  The area where the incision and drainage occurred becomes numb or it tingles. This information is not intended to replace advice given to you by your health care provider. Make sure you discuss any questions you have with your health care provider. Document Released: 09/20/2011 Document Revised: 11/28/2015 Document Reviewed: 04/18/2015 Elsevier Interactive Patient Education  2018 [employer].  General Anesthesia, Adult General anesthesia is the use of medicines to make a person "go to sleep" (be unconscious) for a medical procedure. General anesthesia is often recommended when a procedure:  Is long.  Requires you to be still or in an unusual position.  Is major and can cause you to lose blood.  Is impossible to do without general anesthesia.  The medicines used for general anesthesia are called general anesthetics. In addition to making you sleep, the medicines:  Prevent pain.  Control your blood pressure.  Relax your muscles.  Tell a  health care provider about:  Any allergies you have.  All medicines you are taking, including vitamins, herbs, eye drops, creams, and over-the-counter medicines.  Any problems you or family members have had with anesthetic medicines.  Types of anesthetics you have had in the past.  Any bleeding disorders you have.  Any surgeries you have had.  Any medical conditions you have.  Any history of heart or lung conditions, such as heart failure, sleep apnea, or chronic obstructive pulmonary disease (COPD).  Whether you are pregnant or may be pregnant.  Whether you use tobacco, alcohol, marijuana, or street drugs.  Any history of [employer].  Any history of depression or anxiety. What are the risks? Generally, this is a safe procedure. However, problems may occur, including:  Allergic reaction to anesthetics.  Lung and heart problems.  Inhaling food or liquids from your stomach into your lungs (aspiration).  Injury to nerves.  Waking up during your procedure and being unable to move (rare).  Extreme agitation or a state of mental confusion (delirium) when you wake up from the anesthetic.  Air in the bloodstream, which can lead to stroke.  These problems are more likely to develop if you are having a major surgery or if you have an advanced medical condition. You can prevent some of these complications by answering all of your health care provider's questions thoroughly and by following all pre-procedure instructions. General anesthesia can cause side effects, including:  Nausea or vomiting  A sore throat from the breathing tube.  Feeling cold or shivery.  Feeling tired, washed out, or achy.  Sleepiness or drowsiness.  Confusion or agitation.  What happens before the procedure? Staying hydrated Follow instructions from your health care provider about hydration, which may include:  Up to 2 hours before the procedure - you may continue to drink clear liquids,  such as water, clear fruit juice, black coffee, and plain tea.  Eating and drinking restrictions Follow instructions from your health care provider about eating and drinking, which may include:  8 hours before the procedure - stop eating heavy meals or foods such as meat, fried foods, or fatty foods.  6 hours before the procedure - stop eating light meals or foods, such as toast or cereal.  6 hours before the procedure - stop drinking milk or drinks that contain milk.  2 hours before the procedure - stop drinking clear liquids.  Medicines  Ask your health care provider about: ? Changing or stopping  your regular medicines. This is especially important if you are taking diabetes medicines or blood thinners. ? Taking medicines such as aspirin and ibuprofen. These medicines can thin your blood. Do not take these medicines before your procedure if your health care provider instructs you not to. ? Taking new dietary supplements or medicines. Do not take these during the week before your procedure unless your health care provider approves them.  If you are told to take a medicine or to continue taking a medicine on the day of the procedure, take the medicine with sips of water. General instructions   Ask if you will be going home the same day, the following day, or after a longer hospital stay. ? Plan to have someone take you home. ? Plan to have someone stay with you for the first 24 hours after you leave the hospital or clinic.  For 3-6 weeks before the procedure, try not to use any tobacco products, such as cigarettes, chewing tobacco, and e-cigarettes.  You may brush your teeth on the morning of the procedure, but make sure to spit out the toothpaste. What happens during the procedure?  You will be given anesthetics through a mask and through an IV tube in one of your veins.  You may receive medicine to help you relax (sedative).  As soon as you are asleep, a breathing tube may be  used to help you breathe.  An anesthesia specialist will stay with you throughout the procedure. He or she will help keep you comfortable and safe by continuing to give you medicines and adjusting the amount of medicine that you get. He or she will also watch your blood pressure, pulse, and oxygen levels to make sure that the anesthetics do not cause any problems.  If a breathing tube was used to help you breathe, it will be removed before you wake up. The procedure may vary among health care providers and hospitals. What happens after the procedure?  You will wake up, often slowly, after the procedure is complete, usually in a recovery area.  Your blood pressure, heart rate, breathing rate, and blood oxygen level will be monitored until the medicines you were given have worn off.  You may be given medicine to help you calm down if you feel anxious or agitated.  If you will be going home the same day, your health care provider may check to make sure you can stand, drink, and urinate.  Your health care providers will treat your pain and side effects before you go home.  Do not drive for 24 hours if you received a sedative.  You may: ? Feel nauseous and vomit. ? Have a sore throat. ? Have mental slowness. ? Feel cold or shivery. ? Feel sleepy. ? Feel tired. ? Feel sore or achy, even in parts of your body where you did not have surgery. This information is not intended to replace advice given to you by your health care provider. Make sure you discuss any questions you have with your health care provider. Document Released: 10/05/2007 Document Revised: 12/09/2015 Document Reviewed: 06/12/2015 Elsevier Interactive Patient Education  2018 [employer]. General Anesthesia, Adult, Care After These instructions provide you with information about caring for yourself after your procedure. Your health care provider may also give you more specific instructions. Your treatment has been planned  according to current medical practices, but problems sometimes occur. Call your health care provider if you have any problems or questions after your procedure. What can  I expect after the procedure? After the procedure, it is common to have:  Vomiting.  A sore throat.  Mental slowness.  It is common to feel:  Nauseous.  Cold or shivery.  Sleepy.  Tired.  Sore or achy, even in parts of your body where you did not have surgery.  Follow these instructions at home: For at least 24 hours after the procedure:  Do not: ? Participate in activities where you could fall or become injured. ? Drive. ? Use heavy machinery. ? Drink alcohol. ? Take sleeping pills or medicines that cause drowsiness. ? Make important decisions or sign legal documents. ? Take care of children on your own.  Rest. Eating and drinking  If you vomit, drink water, juice, or soup when you can drink without vomiting.  Drink enough fluid to keep your urine clear or pale yellow.  Make sure you have little or no nausea before eating solid foods.  Follow the diet recommended by your health care provider. General instructions  Have a responsible adult stay with you until you are awake and alert.  Return to your normal activities as told by your health care provider. Ask your health care provider what activities are safe for you.  Take over-the-counter and prescription medicines only as told by your health care provider.  If you smoke, do not smoke without supervision.  Keep all follow-up visits as told by your health care provider. This is important. Contact a health care provider if:  You continue to have nausea or vomiting at home, and medicines are not helpful.  You cannot drink fluids or start eating again.  You cannot urinate after 8-12 hours.  You develop a skin rash.  You have fever.  You have increasing redness at the site of your procedure. Get help right away if:  You have  difficulty breathing.  You have chest pain.  You have unexpected bleeding.  You feel that you are having a life-threatening or urgent problem. This information is not intended to replace advice given to you by your health care provider. Make sure you discuss any questions you have with your health care provider. Document Released: 10/04/2000 Document Revised: 12/01/2015 Document Reviewed: 06/12/2015 Elsevier Interactive Patient Education  [employer].

## 2018-05-23 ENCOUNTER — Other Ambulatory Visit: Payer: Self-pay

## 2018-05-23 ENCOUNTER — Encounter (HOSPITAL_COMMUNITY): Payer: Self-pay

## 2018-05-23 ENCOUNTER — Encounter (HOSPITAL_COMMUNITY)
Admission: RE | Admit: 2018-05-23 | Discharge: 2018-05-23 | Disposition: A | Discharge status: Home / Self Care | Payer: Commercial Managed Care - PPO | Source: Ambulatory Visit | Attending: Orthopedic Surgery | Admitting: Orthopedic Surgery

## 2018-05-23 DIAGNOSIS — Z01812 Encounter for preprocedural laboratory examination: Secondary | ICD-10-CM | POA: Insufficient documentation

## 2018-05-23 LAB — CBC WITH DIFFERENTIAL/PLATELET
Abs Immature Granulocytes: 0.04 10*3/uL (ref 0.00–0.07)
BASOS PCT: 0 %
Basophils Absolute: 0.1 10*3/uL (ref 0.0–0.1)
EOS ABS: 0.2 10*3/uL (ref 0.0–0.5)
Eosinophils Relative: 2 %
HEMATOCRIT: 42.2 % (ref 36.0–46.0)
Hemoglobin: 13.5 g/dL (ref 12.0–15.0)
Immature Granulocytes: 0 %
LYMPHS ABS: 3.1 10*3/uL (ref 0.7–4.0)
Lymphocytes Relative: 25 %
MCH: 27.8 pg (ref 26.0–34.0)
MCHC: 32 g/dL (ref 30.0–36.0)
MCV: 87 fL (ref 80.0–100.0)
MONO ABS: 0.7 10*3/uL (ref 0.1–1.0)
MONOS PCT: 6 %
Neutro Abs: 8.3 10*3/uL — ABNORMAL HIGH (ref 1.7–7.7)
Neutrophils Relative %: 67 %
PLATELETS: 332 10*3/uL (ref 150–400)
RBC: 4.85 MIL/uL (ref 3.87–5.11)
RDW: 14.7 % (ref 11.5–15.5)
WBC: 12.4 10*3/uL — ABNORMAL HIGH (ref 4.0–10.5)
nRBC: 0 % (ref 0.0–0.2)

## 2018-05-23 LAB — BASIC METABOLIC PANEL
Anion gap: 8 (ref 5–15)
BUN: 8 mg/dL (ref 6–20)
CHLORIDE: 108 mmol/L (ref 98–111)
CO2: 22 mmol/L (ref 22–32)
CREATININE: 0.68 mg/dL (ref 0.44–1.00)
Calcium: 8.6 mg/dL — ABNORMAL LOW (ref 8.9–10.3)
GFR calc Af Amer: >60 mL/min (ref >60)
GFR calc non Af Amer: >60 mL/min (ref >60)
GLUCOSE: 105 mg/dL — AB (ref 70–99)
POTASSIUM: 3.9 mmol/L (ref 3.5–5.1)
SODIUM: 138 mmol/L (ref 135–145)

## 2018-05-23 LAB — HCG, SERUM, QUALITATIVE: PREG SERUM: NEGATIVE

## 2018-05-23 NOTE — H&P (Signed)
Bonnie Aguilar is an 42 y.o. female.   Chief Complaint: Drainage wound surgical right ankle HPI: 42 year old female had open treatment internal fixation of the right ankle in September 2019.  She developed postoperative drainage approximately 1 weeks ago and was given 1 week of antibiotics without improvement  She had a posterior approach for trimalleolar fracture with posterior plating and posterior lateral plating  She is regained her ankle range of motion.  No past medical history on file.  No major medical problems by history  Past Surgical History:  Procedure Laterality Date  . ORIF ANKLE FRACTURE Right 03/23/2018   Procedure: OPEN REDUCTION INTERNAL FIXATION (ORIF) RIGHT ANKLE FRACTURE;  Surgeon: Carole Civil, MD;  Location: [facility];  Service: Orthopedics;  Laterality: Right;  . TOOTH EXTRACTION      Family History  Problem Relation Age of Onset  . Cancer Mother   . Epilepsy Father   . Cancer Maternal Grandmother    Social History:  reports that she has been smoking cigarettes. She has a 12.50 pack-year smoking history. She has never used smokeless tobacco. She reports that she drinks alcohol. She reports that she has current or past drug history.  Allergies: No Known Allergies  No medications prior to admission.    Results for orders placed or performed during the hospital encounter of 05/23/18 (from the past 48 hour(s))  hCG, serum, qualitative (Not at [facility])     Status: None   Collection Time: 05/23/18 11:17 AM  Result Value Ref Range   Preg, Serum NEGATIVE NEGATIVE    Comment:        THE SENSITIVITY OF THIS METHODOLOGY IS >10 mIU/mL. Performed at [facility], [address], [place], [place] [address]   Basic metabolic panel     Status: Abnormal   Collection Time: 05/23/18 11:17 AM  Result Value Ref Range   Sodium 138 135 - 145 mmol/L   Potassium 3.9 3.5 - 5.1 mmol/L   Chloride 108 98 - 111 mmol/L   CO2 22 22 - 32 mmol/L   Glucose, Bld 105 (H) 70 - 99  mg/dL   BUN 8 6 - 20 mg/dL   Creatinine, Ser 0.68 0.44 - 1.00 mg/dL   Calcium 8.6 (L) 8.9 - 10.3 mg/dL   GFR calc non Af Amer >60 >60 mL/min   GFR calc Af Amer >60 >60 mL/min    Comment: (NOTE) The eGFR has been calculated using the CKD EPI equation. This calculation has not been validated in all clinical situations. eGFR's persistently <60 mL/min signify possible Chronic Kidney Disease.    Anion gap 8 5 - 15    Comment: Performed at [facility], [address] [place], [place] [address]  CBC WITH DIFFERENTIAL     Status: Abnormal   Collection Time: 05/23/18 11:17 AM  Result Value Ref Range   WBC 12.4 (H) 4.0 - 10.5 K/uL   RBC 4.85 3.87 - 5.11 MIL/uL   Hemoglobin 13.5 12.0 - 15.0 g/dL   HCT 42.2 36.0 - 46.0 %   MCV 87.0 80.0 - 100.0 fL   MCH 27.8 26.0 - 34.0 pg   MCHC 32.0 30.0 - 36.0 g/dL   RDW 14.7 11.5 - 15.5 %   Platelets 332 150 - 400 K/uL   nRBC 0.0 0.0 - 0.2 %   Neutrophils Relative % 67 %   Neutro Abs 8.3 (H) 1.7 - 7.7 K/uL   Lymphocytes Relative 25 %   Lymphs Abs 3.1 0.7 - 4.0 K/uL  Monocytes Relative 6 %   Monocytes Absolute 0.7 0.1 - 1.0 K/uL   Eosinophils Relative 2 %   Eosinophils Absolute 0.2 0.0 - 0.5 K/uL   Basophils Relative 0 %   Basophils Absolute 0.1 0.0 - 0.1 K/uL   Immature Granulocytes 0 %   Abs Immature Granulocytes 0.04 0.00 - 0.07 K/uL    Comment: Performed at [facility], [address] [place], [place] [address]   Dg Ankle Complete Right  Result Date: 05/22/2018 Radiology report at [facility] orthopedics 3 views right ankle status post internal fixation right ankle.  Fibula is out to length ankle mortise is reduced posterior malleolus is reduced.  Hardware looks intact Impression fracture appears to have healed in good position without hardware complication   Review of Systems  Constitutional: Negative for chills and fever.  Musculoskeletal: Positive for joint pain.  Neurological: Positive for tingling and sensory change.  All other  systems reviewed and are negative.   Last menstrual period 05/22/2018. Physical Exam  Constitutional: She is oriented to person, place, and time. She appears well-developed and well-nourished. No distress.  HENT:  Head: Normocephalic and atraumatic.  Right Ear: External ear normal.  Left Ear: External ear normal.  Eyes: Pupils are equal, round, and reactive to light. Conjunctivae and EOM are normal. Right eye exhibits no discharge. Left eye exhibits no discharge. No scleral icterus.  Neck: Normal range of motion. Neck supple. No JVD present. No thyromegaly present.  Cardiovascular: Normal rate and intact distal pulses.  Respiratory: Effort normal and breath sounds normal. No stridor. No respiratory distress.  Musculoskeletal:       Feet:  Lymphadenopathy:    She has no cervical adenopathy.  Neurological: She is alert and oriented to person, place, and time. She has normal reflexes. She displays normal reflexes. No cranial nerve deficit. Coordination normal.  Skin: Skin is warm and dry. No rash noted. She is not diaphoretic. There is erythema. No pallor.  Psychiatric: She has a normal mood and affect. Her behavior is normal. Judgment and thought content normal.     Assessment/Plan Postop surgical wound infection right ankle status post open treatment internal fixation trimalleolar fracture right ankle  Plan incision and drainage right ankle Arther Abbott, MD 05/23/2018, 3:04 PM

## 2018-05-25 ENCOUNTER — Encounter (HOSPITAL_COMMUNITY): Payer: Self-pay | Admitting: Anesthesiology

## 2018-05-25 ENCOUNTER — Encounter (HOSPITAL_COMMUNITY)
Admission: RE | Disposition: A | Discharge status: Home / Self Care | Payer: Self-pay | Source: Ambulatory Visit | Attending: Orthopedic Surgery

## 2018-05-25 ENCOUNTER — Ambulatory Visit (HOSPITAL_COMMUNITY): Payer: Self-pay | Admitting: Anesthesiology

## 2018-05-25 ENCOUNTER — Ambulatory Visit (HOSPITAL_COMMUNITY)
Admission: RE | Admit: 2018-05-25 | Discharge: 2018-05-25 | Disposition: A | Discharge status: Home / Self Care | Payer: Self-pay | Source: Ambulatory Visit | Attending: Orthopedic Surgery | Admitting: Orthopedic Surgery

## 2018-05-25 DIAGNOSIS — T8141XA Infection following a procedure, superficial incisional surgical site, initial encounter: Secondary | ICD-10-CM | POA: Insufficient documentation

## 2018-05-25 DIAGNOSIS — F17210 Nicotine dependence, cigarettes, uncomplicated: Secondary | ICD-10-CM | POA: Insufficient documentation

## 2018-05-25 DIAGNOSIS — T8149XA Infection following a procedure, other surgical site, initial encounter: Secondary | ICD-10-CM

## 2018-05-25 DIAGNOSIS — Y838 Other surgical procedures as the cause of abnormal reaction of the patient, or of later complication, without mention of misadventure at the time of the procedure: Secondary | ICD-10-CM | POA: Insufficient documentation

## 2018-05-25 HISTORY — PX: INCISION AND DRAINAGE ABSCESS: SHX5864

## 2018-05-25 SURGERY — INCISION AND DRAINAGE, ABSCESS
Anesthesia: General | Site: Ankle | Laterality: Right

## 2018-05-25 MED ORDER — KETOROLAC TROMETHAMINE 30 MG/ML IJ SOLN
30.0000 mg | Freq: Once | INTRAMUSCULAR | Status: AC
  Administered 2018-05-25: 30 mg via INTRAVENOUS
  Filled 2018-05-25: qty 1

## 2018-05-25 MED ORDER — PROMETHAZINE HCL 25 MG/ML IJ SOLN: 6.2500 mg | INTRAMUSCULAR | Status: DC | PRN

## 2018-05-25 MED ORDER — IBUPROFEN 800 MG PO TABS: 800.0000 mg | ORAL_TABLET | Freq: Three times a day (TID) | ORAL | 1 refills | Status: DC | PRN

## 2018-05-25 MED ORDER — FENTANYL CITRATE (PF) 100 MCG/2ML IJ SOLN
INTRAMUSCULAR | Status: DC | PRN
  Administered 2018-05-25 (×5): 50 ug via INTRAVENOUS

## 2018-05-25 MED ORDER — LACTATED RINGERS IV SOLN: INTRAVENOUS | Status: DC

## 2018-05-25 MED ORDER — FENTANYL CITRATE (PF) 250 MCG/5ML IJ SOLN
INTRAMUSCULAR | Status: AC
  Filled 2018-05-25: qty 5

## 2018-05-25 MED ORDER — MIDAZOLAM HCL 2 MG/2ML IJ SOLN
INTRAMUSCULAR | Status: AC
  Filled 2018-05-25: qty 2

## 2018-05-25 MED ORDER — PROPOFOL 10 MG/ML IV BOLUS
INTRAVENOUS | Status: DC | PRN
  Administered 2018-05-25: 160 mg via INTRAVENOUS

## 2018-05-25 MED ORDER — ONDANSETRON HCL 4 MG/2ML IJ SOLN
INTRAMUSCULAR | Status: AC
  Filled 2018-05-25: qty 2

## 2018-05-25 MED ORDER — HYDROCODONE-ACETAMINOPHEN 7.5-325 MG PO TABS: 1.0000 | ORAL_TABLET | Freq: Once | ORAL | Status: DC | PRN

## 2018-05-25 MED ORDER — CEFAZOLIN SODIUM-DEXTROSE 2-4 GM/100ML-% IV SOLN
2.0000 g | INTRAVENOUS | Status: AC
  Administered 2018-05-25: 2 g via INTRAVENOUS
  Filled 2018-05-25: qty 100

## 2018-05-25 MED ORDER — ARTIFICIAL TEARS OPHTHALMIC OINT
TOPICAL_OINTMENT | OPHTHALMIC | Status: AC
  Filled 2018-05-25: qty 3.5

## 2018-05-25 MED ORDER — ONDANSETRON HCL 4 MG/2ML IJ SOLN: 4.0000 mg | Freq: Once | INTRAMUSCULAR | Status: DC

## 2018-05-25 MED ORDER — PROPOFOL 10 MG/ML IV BOLUS
INTRAVENOUS | Status: AC
  Filled 2018-05-25: qty 40

## 2018-05-25 MED ORDER — BUPIVACAINE HCL (PF) 0.5 % IJ SOLN
INTRAMUSCULAR | Status: AC
  Filled 2018-05-25: qty 60

## 2018-05-25 MED ORDER — ONDANSETRON HCL 4 MG/2ML IJ SOLN
INTRAMUSCULAR | Status: DC | PRN
  Administered 2018-05-25: 4 mg via INTRAVENOUS

## 2018-05-25 MED ORDER — SULFAMETHOXAZOLE-TRIMETHOPRIM 400-80 MG PO TABS: 1.0000 | ORAL_TABLET | Freq: Two times a day (BID) | ORAL | 0 refills | Status: DC

## 2018-05-25 MED ORDER — HYDROCODONE-ACETAMINOPHEN 10-325 MG PO TABS: 1.0000 | ORAL_TABLET | ORAL | 0 refills | Status: AC | PRN

## 2018-05-25 MED ORDER — MEPERIDINE HCL 50 MG/ML IJ SOLN: 6.2500 mg | INTRAMUSCULAR | Status: DC | PRN

## 2018-05-25 MED ORDER — 0.9 % SODIUM CHLORIDE (POUR BTL) OPTIME
TOPICAL | Status: DC | PRN
  Administered 2018-05-25: 1000 mL

## 2018-05-25 MED ORDER — HYDROMORPHONE HCL 1 MG/ML IJ SOLN
0.2500 mg | INTRAMUSCULAR | Status: DC | PRN
  Administered 2018-05-25: 0.5 mg via INTRAVENOUS
  Filled 2018-05-25: qty 0.5

## 2018-05-25 MED ORDER — MIDAZOLAM HCL 5 MG/5ML IJ SOLN
INTRAMUSCULAR | Status: DC | PRN
  Administered 2018-05-25: 2 mg via INTRAVENOUS

## 2018-05-25 MED ORDER — OXYCODONE HCL 5 MG PO TABS
5.0000 mg | ORAL_TABLET | Freq: Once | ORAL | Status: AC
  Administered 2018-05-25: 5 mg via ORAL
  Filled 2018-05-25: qty 1

## 2018-05-25 MED ORDER — CHLORHEXIDINE GLUCONATE 4 % EX LIQD: 60.0000 mL | Freq: Once | CUTANEOUS | Status: DC

## 2018-05-25 MED ORDER — LACTATED RINGERS IV SOLN
INTRAVENOUS | Status: DC
  Administered 2018-05-25: 14:00:00 via INTRAVENOUS

## 2018-05-25 SURGICAL SUPPLY — 35 items
BANDAGE ELASTIC 4 LF NS (GAUZE/BANDAGES/DRESSINGS) ×3 IMPLANT
BANDAGE ELASTIC 6 LF NS (GAUZE/BANDAGES/DRESSINGS) ×3 IMPLANT
BANDAGE ESMARK 4X12 BL STRL LF (DISPOSABLE) ×1 IMPLANT
BNDG CONFORM 2 STRL LF (GAUZE/BANDAGES/DRESSINGS) ×3 IMPLANT
BNDG ESMARK 4X12 BLUE STRL LF (DISPOSABLE) ×3
CLOTH BEACON ORANGE TIMEOUT ST (SAFETY) ×3 IMPLANT
COVER LIGHT HANDLE STERIS (MISCELLANEOUS) ×6 IMPLANT
DRAPE ORTHO 2.5IN SPLIT 77X108 (DRAPES) IMPLANT
DRAPE ORTHO SPLIT 77X108 STRL (DRAPES)
ELECT REM PT RETURN 9FT ADLT (ELECTROSURGICAL) ×3
ELECTRODE REM PT RTRN 9FT ADLT (ELECTROSURGICAL) ×1 IMPLANT
GAUZE SPONGE 4X4 12PLY STRL (GAUZE/BANDAGES/DRESSINGS) ×3 IMPLANT
GAUZE XEROFORM 5X9 LF (GAUZE/BANDAGES/DRESSINGS) ×3 IMPLANT
GLOVE BIOGEL PI IND STRL 7.0 (GLOVE) ×1 IMPLANT
GLOVE BIOGEL PI INDICATOR 7.0 (GLOVE) ×2
GLOVE OPTIFIT SS 8.0 STRL (GLOVE) ×3 IMPLANT
GLOVE SKINSENSE NS SZ8.0 LF (GLOVE) ×2
GLOVE SKINSENSE STRL SZ8.0 LF (GLOVE) ×1 IMPLANT
GOWN STRL REUS W/TWL LRG LVL3 (GOWN DISPOSABLE) ×3 IMPLANT
GOWN STRL REUS W/TWL XL LVL3 (GOWN DISPOSABLE) ×3 IMPLANT
INST SET MINOR BONE (KITS) ×3 IMPLANT
KIT TURNOVER KIT A (KITS) ×3 IMPLANT
MANIFOLD NEPTUNE II (INSTRUMENTS) ×3 IMPLANT
MARKER SKIN DUAL TIP RULER LAB (MISCELLANEOUS) ×3 IMPLANT
NS IRRIG 1000ML POUR BTL (IV SOLUTION) ×3 IMPLANT
PACK BASIC LIMB (CUSTOM PROCEDURE TRAY) ×3 IMPLANT
PACK MINOR (CUSTOM PROCEDURE TRAY) IMPLANT
PAD ABD 5X9 TENDERSORB (GAUZE/BANDAGES/DRESSINGS) ×3 IMPLANT
PAD ARMBOARD 7.5X6 YLW CONV (MISCELLANEOUS) ×3 IMPLANT
PADDING WEBRIL 6 STERILE (GAUZE/BANDAGES/DRESSINGS) ×3 IMPLANT
SET BASIN LINEN APH (SET/KITS/TRAYS/PACK) ×3 IMPLANT
SUT ETHILON 3 0 FSL (SUTURE) ×6 IMPLANT
SWAB CULTURE LIQ STUART DBL (MISCELLANEOUS) ×3 IMPLANT
SYR BULB IRRIGATION 50ML (SYRINGE) ×3 IMPLANT
TUBE ANAEROBIC PORT A CUL  W/M (MISCELLANEOUS) ×3 IMPLANT

## 2018-05-25 NOTE — Discharge Instructions (Signed)
I opened the wound I saw some cloudy gray fluid but no pus, I washed the wound out and closed it with nylon sutures       Monitored Anesthesia Care, Care After These instructions provide you with information about caring for yourself after your procedure. Your health care provider may also give you more specific instructions. Your treatment has been planned according to current medical practices, but problems sometimes occur. Call your health care provider if you have any problems or questions after your procedure. What can I expect after the procedure? After your procedure, it is common to:  Feel sleepy for several hours.  Feel clumsy and have poor balance for several hours.  Feel forgetful about what happened after the procedure.  Have poor judgment for several hours.  Feel nauseous or vomit.  Have a sore throat if you had a breathing tube during the procedure.  Follow these instructions at home: For at least 24 hours after the procedure:   Do not: ? Participate in activities in which you could fall or become injured. ? Drive. ? Use heavy machinery. ? Drink alcohol. ? Take sleeping pills or medicines that cause drowsiness. ? Make important decisions or sign legal documents. ? Take care of children on your own.  Rest. Eating and drinking  Follow the diet that is recommended by your health care provider.  If you vomit, drink water, juice, or soup when you can drink without vomiting.  Make sure you have little or no nausea before eating solid foods. General instructions  Have a responsible adult stay with you until you are awake and alert.  Take over-the-counter and prescription medicines only as told by your health care provider.  If you smoke, do not smoke without supervision.  Keep all follow-up visits as told by your health care provider. This is important. Contact a health care provider if:  You keep feeling nauseous or you keep vomiting.  You feel  light-headed.  You develop a rash.  You have a fever. Get help right away if:  You have trouble breathing. This information is not intended to replace advice given to you by your health care provider. Make sure you discuss any questions you have with your health care provider. Document Released: 10/19/2015 Document Revised: 02/18/2016 Document Reviewed: 10/19/2015 Elsevier Interactive Patient Education  [employer]2018 [employer]

## 2018-05-25 NOTE — Anesthesia Postprocedure Evaluation (Signed)
Anesthesia Post Note  Patient: Bonnie Aguilar  Procedure(s) Performed: INCISION AND DRAINAGE ABSCESS (surgical incision right ankle) (Right Ankle)  Patient location during evaluation: Short Stay Anesthesia Type: General Level of consciousness: awake and alert and patient cooperative Pain management: satisfactory to patient Vital Signs Assessment: post-procedure vital signs reviewed and stable Respiratory status: spontaneous breathing Cardiovascular status: stable Postop Assessment: no apparent nausea or vomiting Anesthetic complications: no     Last Vitals:  Vitals:   05/25/18 1510 05/25/18 1516  BP: 106/61 110/63  Pulse: 89 91  Resp: (!) 24 (!) 22  Temp:  36.6 C  SpO2: 98% 96%    Last Pain:  Vitals:   05/25/18 1516  TempSrc: Oral  PainSc: 3                  Berlinda Farve

## 2018-05-25 NOTE — Anesthesia Preprocedure Evaluation (Signed)
Anesthesia Evaluation    Airway Mallampati: II       Dental  (+) Edentulous Lower, Edentulous Upper   Pulmonary Current Smoker,           Cardiovascular      Neuro/Psych    GI/Hepatic   Endo/Other    Renal/GU      Musculoskeletal   Abdominal   Peds  Hematology   Anesthesia Other Findings Ongoing tobacco abuse  Reproductive/Obstetrics                             Anesthesia Physical Anesthesia Plan  ASA: II  Anesthesia Plan: General   Post-op Pain Management:    Induction:   PONV Risk Score and Plan:   Airway Management Planned:   Additional Equipment:   Intra-op Plan:   Post-operative Plan:   Informed Consent:   Plan Discussed with: Anesthesiologist  Anesthesia Plan Comments:         Anesthesia Quick Evaluation

## 2018-05-25 NOTE — Op Note (Addendum)
05/25/2018  2:33 PM  PATIENT:  Bonnie Aguilar  42 y.o. female  PRE-OPERATIVE DIAGNOSIS:  non healing surgical wound, with drainage right ankle  POST-OPERATIVE DIAGNOSIS:  non healing surgical wound, with drainage right ankle  PROCEDURE:  Procedure(s): INCISION AND DRAINAGE ABSCESS (surgical incision right ankle) (Right) simple superficial  10060  SURGEON:  Surgeon(s) and Role:    * Harrison, Stanley E, MD - Primary  Findings: There is no purulent drainage found.  There was some cloudy fluid proximally in the wound superficial to the deep fascia.  Procedure details  Duane Kostelnik was identified in preop using appropriate tools for identification.  Surgical site confirmed marked chart review completed.  Patient taken to surgery.  Patient placed supine general anesthetic administered 10 pounds of sandbag placed under the right hip and a set of blankets placed under the ankle to elevated and internally rotated.  Sterile prep and drape performed with Betadine.  After timeout was completed the limb was exsanguinated with a 4 inch Esmarch tourniquet elevated.  I opened the proximal portion of the incision where they had been drainage did not find any drainage I did find some cloudy gray fluid I extended the incision further and opened it down to the fascial layer.  There was no purulent material everything looked clean I irrigated it with a liter and a half of saline and closed it with interrupted 3-0 nylon sutures.  I placed a sterile dressing released the tourniquet and then the patient was extubated and taken to recovery room in stable condition  PHYSICIAN ASSISTANT:   ASSISTANTS: none   ANESTHESIA:   general  EBL:  none   BLOOD ADMINISTERED:none  DRAINS: none   LOCAL MEDICATIONS USED:  NONE  SPECIMEN:  Source of Specimen:  Anaerobic and aerobic culture right ankle wound  DISPOSITION OF SPECIMEN:  Microbiology  COUNTS:  YES  TOURNIQUET:   Total Tourniquet Time  Documented: Thigh (Right) - 20 minutes Total: Thigh (Right) - 20 minutes   DICTATION: .Dragon Dictation  PLAN OF CARE: Discharge to home after PACU  PATIENT DISPOSITION:  PACU - hemodynamically stable.   Delay start of Pharmacological VTE agent (>24hrs) due to surgical blood loss or risk of bleeding: not applicable  

## 2018-05-25 NOTE — Transfer of Care (Signed)
Immediate Anesthesia Transfer of Care Note  Patient: Bonnie KassChristina Kilmartin  Procedure(s) Performed: INCISION AND DRAINAGE ABSCESS (surgical incision right ankle) (Right Ankle)  Patient Location: PACU  Anesthesia Type:General  Level of Consciousness: awake  Airway & Oxygen Therapy: Patient Spontanous Breathing  Post-op Assessment: Report given to RN  Post vital signs: Reviewed  Last Vitals:  Vitals Value Taken Time  BP 112/45 05/25/2018  2:40 PM  Temp 36.6 C 05/25/2018  2:40 PM  Pulse 91 05/25/2018  2:44 PM  Resp 13 05/25/2018  2:44 PM  SpO2 97 % 05/25/2018  2:44 PM  Vitals shown include unvalidated device data.  Last Pain:  Vitals:   05/25/18 1440  TempSrc:   PainSc: 6       Patients Stated Pain Goal: 4 (05/25/18 1440)  Complications: No apparent anesthesia complications

## 2018-05-25 NOTE — Anesthesia Procedure Notes (Signed)
Procedure Name: LMA Insertion Date/Time: 05/25/2018 1:57 PM Performed by: Pernell DupreAdams, Mohab Ashby A, CRNA Pre-anesthesia Checklist: Patient identified, Timeout performed, Emergency Drugs available, Suction available and Patient being monitored Patient Re-evaluated:Patient Re-evaluated prior to induction Oxygen Delivery Method: Circle system utilized Preoxygenation: Pre-oxygenation with 100% oxygen Induction Type: IV induction Ventilation: Mask ventilation without difficulty LMA: LMA inserted LMA Size: 4.0 Number of attempts: 1 Placement Confirmation: positive ETCO2 and breath sounds checked- equal and bilateral Tube secured with: Tape Dental Injury: Teeth and Oropharynx as per pre-operative assessment

## 2018-05-25 NOTE — Brief Op Note (Signed)
05/25/2018  2:33 PM  PATIENT:  Bonnie Aguilar  42 y.o. female  PRE-OPERATIVE DIAGNOSIS:  non healing surgical wound, with drainage right ankle  POST-OPERATIVE DIAGNOSIS:  non healing surgical wound, with drainage right ankle  PROCEDURE:  Procedure(s): INCISION AND DRAINAGE ABSCESS (surgical incision right ankle) (Right) simple superficial  [id number]  SURGEON:  Surgeon(s) and Role:    Vickki Hearing* Jock Mahon E, MD - Primary  Findings: There is no purulent drainage found.  There was some cloudy fluid proximally in the wound superficial to the deep fascia.  Procedure details  Bonnie Aguilar was identified in preop using appropriate tools for identification.  Surgical site confirmed marked chart review completed.  Patient taken to surgery.  Patient placed supine general anesthetic administered 10 pounds of sandbag placed under the right hip and a set of blankets placed under the ankle to elevated and internally rotated.  Sterile prep and drape performed with Betadine.  After timeout was completed the limb was exsanguinated with a 4 inch Esmarch tourniquet elevated.  I opened the proximal portion of the incision where they had been drainage did not find any drainage I did find some cloudy gray fluid I extended the incision further and opened it down to the fascial layer.  There was no purulent material everything looked clean I irrigated it with a liter and a half of saline and closed it with interrupted 3-0 nylon sutures.  I placed a sterile dressing released the tourniquet and then the patient was extubated and taken to recovery room in stable condition  PHYSICIAN ASSISTANT:   ASSISTANTS: none   ANESTHESIA:   general  EBL:  none   BLOOD ADMINISTERED:none  DRAINS: none   LOCAL MEDICATIONS USED:  NONE  SPECIMEN:  Source of Specimen:  Anaerobic and aerobic culture right ankle wound  DISPOSITION OF SPECIMEN:  Microbiology  COUNTS:  YES  TOURNIQUET:   Total Tourniquet Time  Documented: Thigh (Right) - 20 minutes Total: Thigh (Right) - 20 minutes   DICTATION: .Dragon Dictation  PLAN OF CARE: Discharge to home after PACU  PATIENT DISPOSITION:  PACU - hemodynamically stable.   Delay start of Pharmacological VTE agent (>24hrs) due to surgical blood loss or risk of bleeding: not applicable

## 2018-05-25 NOTE — Interval H&P Note (Signed)
History and Physical Interval Note:  05/25/2018 1:39 PM  Bonnie Aguilar  has presented today for surgery, with the diagnosis of non healing surgical wound, with drainage right ankle  The various methods of treatment have been discussed with the patient and family. After consideration of risks, benefits and other options for treatment, the patient has consented to  Procedure(s): INCISION AND DRAINAGE ABSCESS (surgical incision right ankle) (Right) as a surgical intervention .  The patient's history has been reviewed, patient examined, no change in status, stable for surgery.  I have reviewed the patient's chart and labs.  Questions were answered to the patient's satisfaction.     Fuller CanadaStanley Sadia Belfiore

## 2018-05-26 ENCOUNTER — Telehealth: Payer: Self-pay | Admitting: Orthopedic Surgery

## 2018-05-26 ENCOUNTER — Encounter (HOSPITAL_COMMUNITY): Payer: Self-pay | Admitting: Orthopedic Surgery

## 2018-05-26 NOTE — Telephone Encounter (Signed)
Called her, leave on until Monday

## 2018-05-26 NOTE — Telephone Encounter (Signed)
Patient called to ask if she's to change her dressing, and if so, how ofen?  She is status/post incision and drainage, right ankle, 05/25/18. Aware of appointment Monday, 05/29/18.  Ph#'s: [phone] or [phone].

## 2018-05-29 ENCOUNTER — Encounter: Payer: Self-pay | Admitting: Orthopedic Surgery

## 2018-05-29 ENCOUNTER — Ambulatory Visit (INDEPENDENT_AMBULATORY_CARE_PROVIDER_SITE_OTHER): Payer: Self-pay | Admitting: Orthopedic Surgery

## 2018-05-29 VITALS — BP 109/80 | HR 126 | Ht 66.0 in

## 2018-05-29 DIAGNOSIS — Z8781 Personal history of (healed) traumatic fracture: Secondary | ICD-10-CM

## 2018-05-29 DIAGNOSIS — Z9889 Other specified postprocedural states: Secondary | ICD-10-CM

## 2018-05-29 NOTE — Progress Notes (Signed)
POST OP VISIT   Patient ID: Bonnie Aguilar, female   DOB: 1976/05/17, 42 y.o.   MRN: [id number]  Chief Complaint  Patient presents with  . Routine Post Op    right ankle I &D 05/25/18    Encounter Diagnosis  Name Primary?  . S/P ORIF (open reduction internal fixation) fracture right ankle 03/23/18 I and D incision 05/25/18 Yes   SHE SAYS SHE FEELS MUCH BETTER   HER SUTURE LINE LOOKS GOOD 2 SUTURES ARE LOOSE BUT I M LEAVING THEM AS IS    Findings at surgery:   05/25/2018 2:33 PM PATIENT:  Bonnie Aguilar  42 y.o. female PRE-OPERATIVE DIAGNOSIS:  non healing surgical wound, with drainage right ankle POST-OPERATIVE DIAGNOSIS:  non healing surgical wound, with drainage right ankle PROCEDURE:  Procedure(s): INCISION AND DRAINAGE ABSCESS (surgical incision right ankle) (Right) simple superficial  [id number]  SURGEON:  Surgeon(s) and Role:    Vickki Hearing* Cuinn Westerhold E, MD - Primary  Findings: There is no purulent drainage found.  There was some cloudy fluid proximally in the wound superficial to the deep fascia.  WOUND  Performed at [facility], [address] [place], [place] [address]  Special RequestsRIGHT ANKLE  Performed at [facility], [address] [place], [place] [address]   Gram StainNO WBC SEEN  NO ORGANISMS SEEN  CultureNO GROWTH 3 DAYS NO ANAEROBES ISOLATED; CULTURE IN PROGRESS FOR 5 DAYS   Performed at [facility], [address] [place], [place] [address]  Report StatusPENDING   DAILY DRESSING CHANGE  CONTINUE ORAL ANTIBIOTICS UNTIL NOV 22ND   FU DEC 4TH   WORK OK AT HOME    Current Outpatient Medications:  .  HYDROcodone-acetaminophen (NORCO) 10-325 MG tablet, Take 1 tablet by mouth every 4 (four) hours as needed for up to 5 days., Disp: 30 tablet, Rfl: 0 .  ibuprofen (ADVIL,MOTRIN) 800 MG tablet, Take 1 tablet (800 mg total) by mouth every 8 (eight) hours as needed., Disp: 90 tablet, Rfl: 1 .  sulfamethoxazole-trimethoprim (BACTRIM) 400-80 MG  tablet, Take 1 tablet by mouth 2 (two) times daily., Disp: 28 tablet, Rfl: 0

## 2018-05-30 LAB — AEROBIC/ANAEROBIC CULTURE W GRAM STAIN (SURGICAL/DEEP WOUND)
Culture: NO GROWTH
Gram Stain: NONE SEEN

## 2018-05-31 ENCOUNTER — Encounter (HOSPITAL_COMMUNITY): Payer: Self-pay | Admitting: Orthopedic Surgery

## 2018-06-14 ENCOUNTER — Encounter: Payer: Self-pay | Admitting: Orthopedic Surgery

## 2018-06-14 ENCOUNTER — Ambulatory Visit (INDEPENDENT_AMBULATORY_CARE_PROVIDER_SITE_OTHER): Payer: Self-pay | Admitting: Orthopedic Surgery

## 2018-06-14 VITALS — BP 127/56 | HR 105 | Ht 63.0 in | Wt 145.0 lb

## 2018-06-14 DIAGNOSIS — Z8781 Personal history of (healed) traumatic fracture: Secondary | ICD-10-CM

## 2018-06-14 DIAGNOSIS — Z9889 Other specified postprocedural states: Secondary | ICD-10-CM

## 2018-06-14 NOTE — Progress Notes (Signed)
POSTOP VISIT  POD # 20  Chief Complaint  Patient presents with  . Post-op Follow-up    right ankle I and D 05/25/18    42 years old status post posterior approach trimalleolar fracture on September 12 followed by incision and drainage November 14 with sterile culture.     Encounter Diagnosis  Name Primary?  . S/P ORIF (open reduction internal fixation) fracture right ankle 03/23/18 I and D incision 05/25/18 Yes    Her wound looks clean dry it is 5 mm long by 3 mm wide superficial no sign of injection    Postoperative plan (Work, WB, No orders of the defined types were placed in this encounter. ,FU)  The patient is currently without insurance.  She is going to communicate with me through my chart in 4 weeks and I will discuss with her what to do next  In the meantime she is going to use Neosporin and a Band-Aid until the wound heals over completely.

## 2018-06-14 NOTE — Patient Instructions (Addendum)
4 Weeks communication through my chart

## 2019-05-11 IMAGING — RF DG ANKLE 2V *R*
1 series · 7 of 7 positions shown · non-contrast
Comparison: CT of the right ankle March 10, 2018

FLUOROSCOPY TIME:  0 minutes 59 seconds; 7 acquired images

CLINICAL DATA: Open reduction internal fixation for fracture

EXAM:
DG C-ARM 61-120 MIN; RIGHT ANKLE - 2 VIEW

[Series 1: run · 7 of 7 slices shown]
[im 1/7]
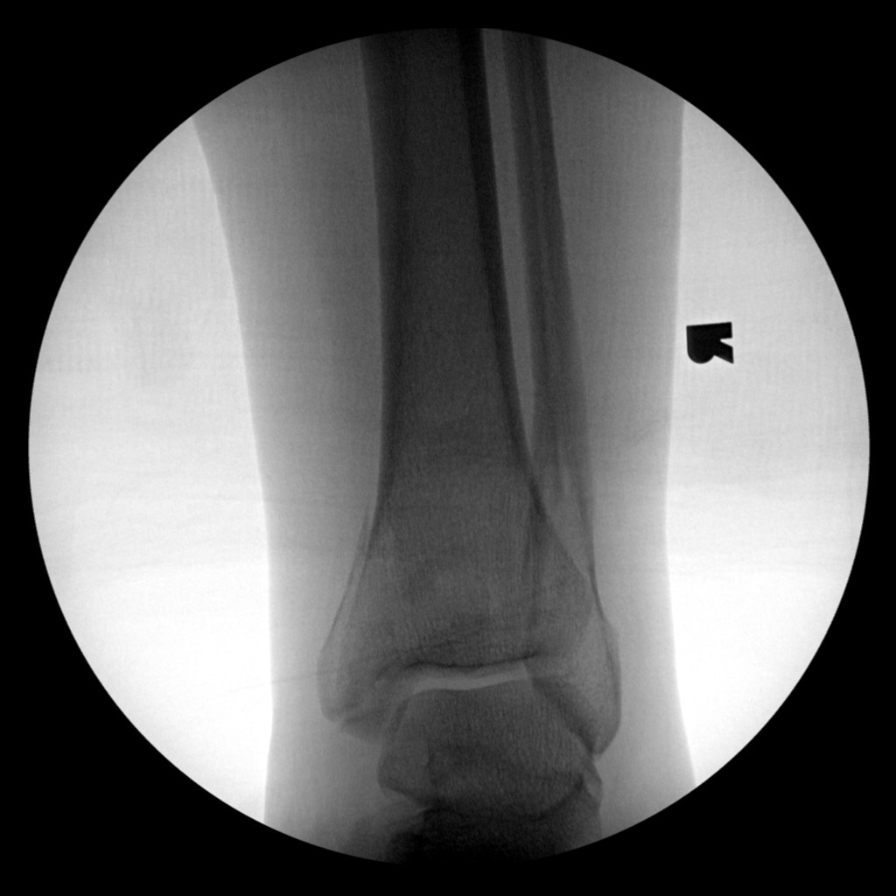
[im 2/7]
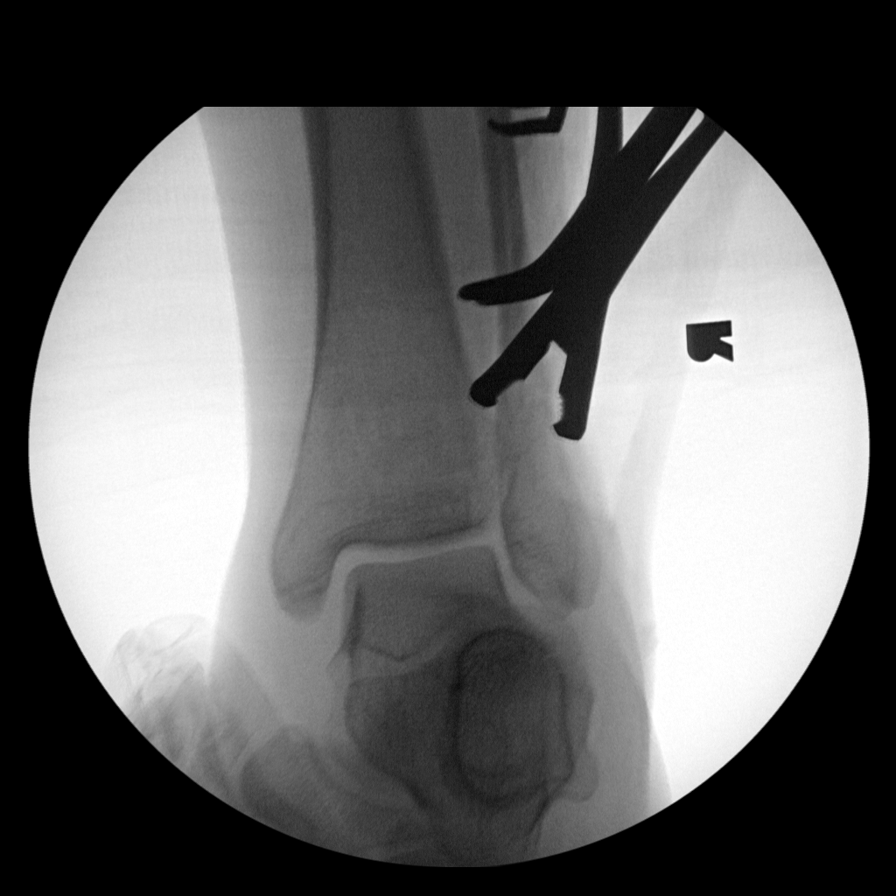
[im 3/7]
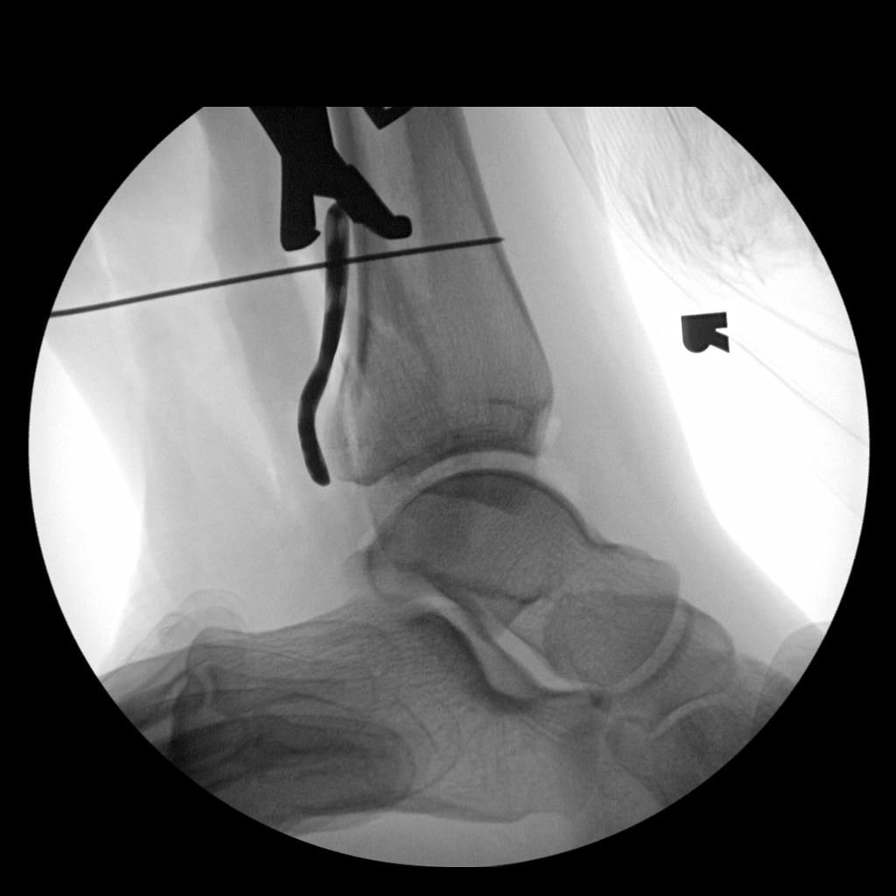
[im 4/7]
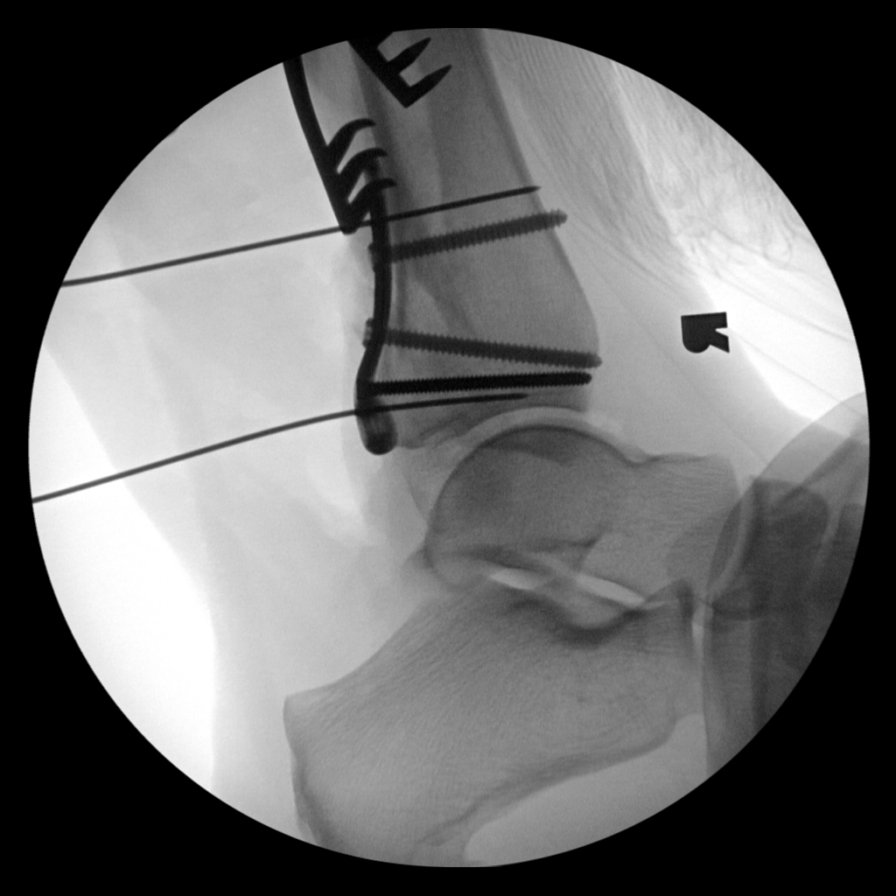
[im 5/7]
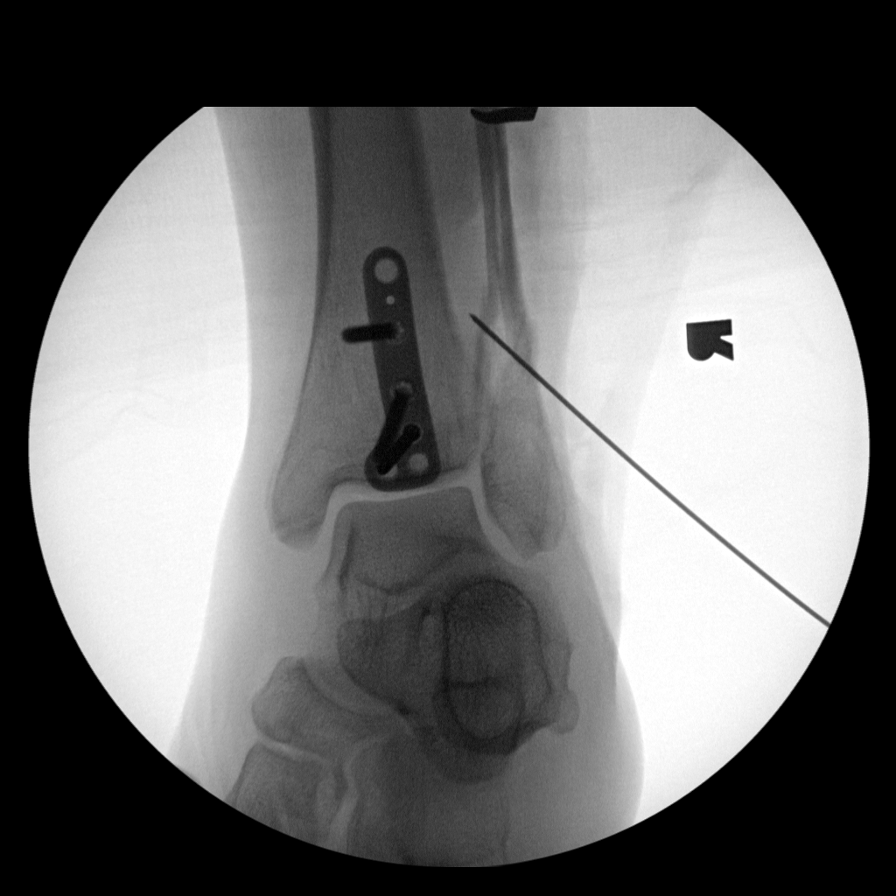
[im 6/7]
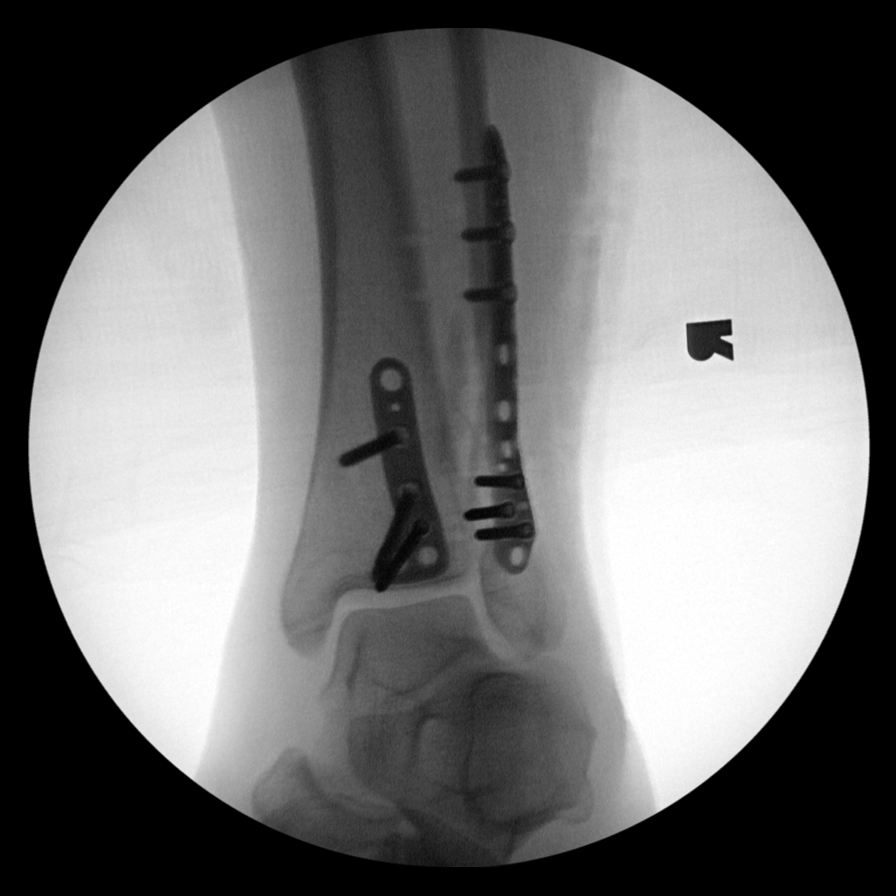
[im 7/7]
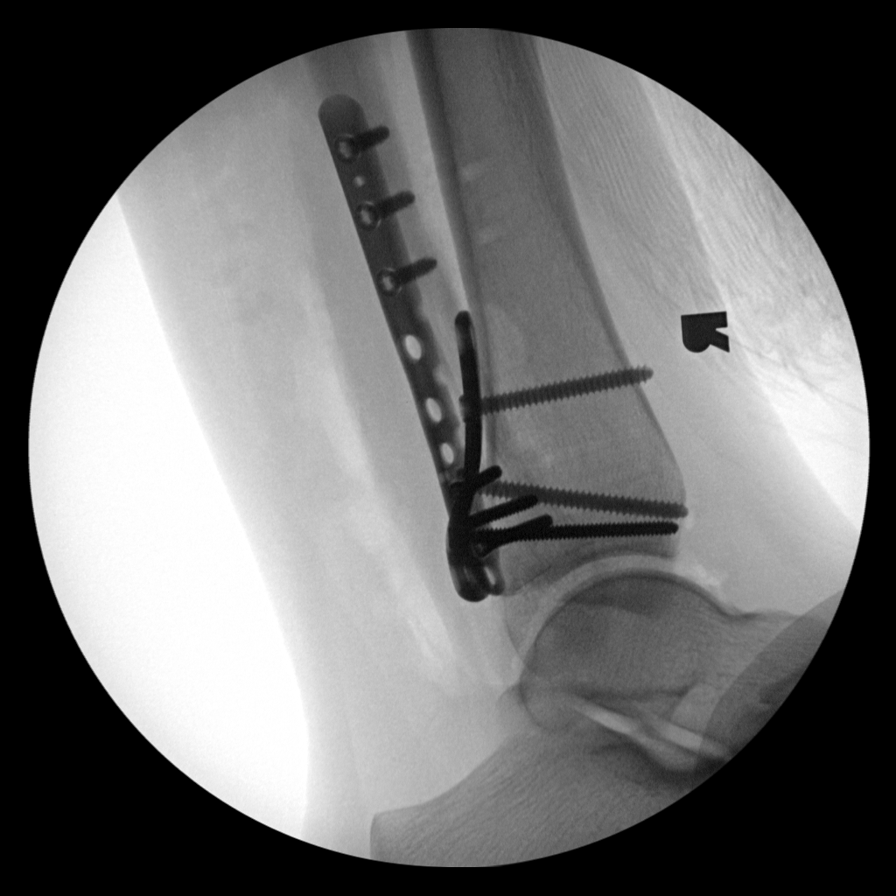

[7 of 7 positions shown; findings below may reference images not displayed]

FINDINGS: Frontal and lateral views were obtained. Images show screw and plate
fixation through fractures of the distal tibia and fibula with
alignment anatomic following screw and plate fixation. Ankle mortise
appears intact.
IMPRESSION: Screw and plate fixation in the distal tibia and fibula with
alignment essentially anatomic following screw and plate fixation.
Ankle mortise appears intact.

## 2020-02-28 ENCOUNTER — Ambulatory Visit: Payer: Medicaid Other | Attending: Internal Medicine

## 2020-02-28 DIAGNOSIS — Z23 Encounter for immunization: Secondary | ICD-10-CM

## 2020-02-28 NOTE — Progress Notes (Signed)
   Covid-19 Vaccination Clinic  Name:  Bonnie Aguilar    MRN: [id number] DOB: 12-08-1975  02/28/2020  [name] was observed post Covid-19 immunization for 15 minutes without incident. She was provided with Vaccine Information Sheet and instruction to access the V-Safe system.   [name] was instructed to call 911 with any severe reactions post vaccine: Marland Kitchen Difficulty breathing  . Swelling of face and throat  . A fast heartbeat  . A bad rash all over body  . Dizziness and weakness   Immunizations Administered    Name Date Dose VIS Date Route   Pfizer COVID-19 Vaccine 02/28/2020 10:21 AM 0.3 mL 09/05/2018 Intramuscular   Manufacturer: [employer], [employer]   Lot: [id number]   NDC: [address]-8

## 2020-03-20 ENCOUNTER — Ambulatory Visit: Payer: Medicaid Other | Attending: Internal Medicine

## 2020-03-20 DIAGNOSIS — Z23 Encounter for immunization: Secondary | ICD-10-CM

## 2020-03-20 NOTE — Progress Notes (Signed)
   Covid-19 Vaccination Clinic  Name:  Kaeleen Odom    MRN: [id number] DOB: Oct 23, 1975  03/20/2020  Ms. Sulton was observed post Covid-19 immunization for 15 minutes without incident. She was provided with Vaccine Information Sheet and instruction to access the V-Safe system.   Ms. Brophy was instructed to call 911 with any severe reactions post vaccine: Marland Kitchen Difficulty breathing  . Swelling of face and throat  . A fast heartbeat  . A bad rash all over body  . Dizziness and weakness   Immunizations Administered    Name Date Dose VIS Date Route   Pfizer COVID-19 Vaccine 03/20/2020  9:27 AM 0.3 mL 09/05/2018 Intramuscular   Manufacturer: [employer], [employer]   Lot: [id number]   NDC: [address]-4

## 2020-08-12 ENCOUNTER — Ambulatory Visit (INDEPENDENT_AMBULATORY_CARE_PROVIDER_SITE_OTHER): Payer: No Typology Code available for payment source | Admitting: Obstetrics & Gynecology

## 2020-08-12 ENCOUNTER — Encounter: Payer: Self-pay | Admitting: Obstetrics & Gynecology

## 2020-08-12 ENCOUNTER — Other Ambulatory Visit: Payer: Self-pay

## 2020-08-12 VITALS — BP 111/73 | HR 94 | Ht 62.0 in | Wt 181.0 lb

## 2020-08-12 DIAGNOSIS — N941 Unspecified dyspareunia: Secondary | ICD-10-CM

## 2020-08-12 DIAGNOSIS — N946 Dysmenorrhea, unspecified: Secondary | ICD-10-CM | POA: Diagnosis not present

## 2020-08-12 DIAGNOSIS — N921 Excessive and frequent menstruation with irregular cycle: Secondary | ICD-10-CM

## 2020-08-12 MED ORDER — MEGESTROL ACETATE 40 MG PO TABS: ORAL_TABLET | ORAL | 3 refills | Status: DC

## 2020-08-12 NOTE — Progress Notes (Signed)
Chief Complaint  Patient presents with  . Menorrhagia    Heavy prolonged periods.       45 y.o. Bonnie Aguilar Patient's last menstrual period was 07/25/2020 (exact date). The current method of family planning is tubal ligation.  Outpatient Encounter Medications as of 08/12/2020  Medication Sig  . megestrol (MEGACE) 40 MG tablet 3 tablets a day for 5 days, 2 tablets a day for 5 days then 1 tablet daily  . [DISCONTINUED] ibuprofen (ADVIL,MOTRIN) 800 MG tablet Take 1 tablet (800 mg total) by mouth every 8 (eight) hours as needed.   No facility-administered encounter medications on file as of 08/12/2020.    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 No pertinent past medical history.  Past Surgical History:  Procedure Laterality Date  . INCISION AND DRAINAGE ABSCESS Right 05/25/2018   Procedure: INCISION AND DRAINAGE ABSCESS (surgical incision right ankle);  Surgeon: Vickki Hearing, MD;  Location: [facility];  Service: Orthopedics;  Laterality: Right;  . ORIF ANKLE FRACTURE Right 03/23/2018   Procedure: OPEN REDUCTION INTERNAL FIXATION (ORIF) RIGHT ANKLE FRACTURE;  Surgeon: Vickki Hearing, MD;  Location: [facility];  Service: Orthopedics;  Laterality: Right;  . TOOTH EXTRACTION      OB History    Gravida  3   Para  2   Term  2   Preterm  0   AB  1   Living  2     SAB      IAB      Ectopic      Multiple      Live Births              No Known Allergies  Social History   Socioeconomic History  . Marital status: Married    Spouse name: Not on file  . Number of children: Not on file  . Years of education: Not on file  . Highest education level: Not on file  Occupational History  . Not on file  Tobacco Use  . Smoking status: Current Every Day Smoker     Packs/day: 0.50    Years: 25.00    Pack years: 12.50    Types: Cigarettes  . Smokeless tobacco: Never Used  Vaping Use  . Vaping Use: Never used  Substance and Sexual Activity  . Alcohol use: Yes    Comment: couple times a week  . Drug use: Not Currently  . Sexual activity: Yes    Birth control/protection: None  Other Topics Concern  . Not on file  Social History Narrative  . Not on file   Social Determinants of Health   Financial Resource Strain: Low Risk   . Difficulty of Paying Living Expenses: Not very hard  Food Insecurity: Food Insecurity Present  . Worried About Programme researcher, broadcasting/film/video in the Last Year: Sometimes true  . Ran Out of Food in the Last Year: Sometimes true  Transportation Needs: No Transportation Needs  . Lack of Transportation (Medical): No  . Lack of Transportation (Non-Medical): No  Physical Activity: Insufficiently Active  . Days of Exercise per Week: 3 days  . Minutes of Exercise per Session: 30 min  Stress: No Stress Concern Present  . Feeling of Stress : Not at all  Social Connections: Moderately Isolated  .  Frequency of Communication with Friends and Family: More than three times a week  . Frequency of Social Gatherings with Friends and Family: More than three times a week  . Attends Religious Services: Never  . Active Member of Clubs or Organizations: No  . Attends Banker Meetings: Never  . Marital Status: Married    Family History  Problem Relation Age of Onset  . Cancer Mother   . Epilepsy Father   . Cancer Maternal Grandmother     Medications:       Current Outpatient Medications:  .  megestrol (MEGACE) 40 MG tablet, 3 tablets a day for 5 days, 2 tablets a day for 5 days then 1 tablet daily, Disp: 45 tablet, Rfl: 3  Objective Blood pressure 111/73, pulse 94, height 5\' 2"  (1.575 m), weight 181 lb (82.1 kg), last menstrual period 07/25/2020.  General WDWN female NAD Vulva:  normal appearing vulva with no masses,  tenderness or lesions Vagina:  normal mucosa, no discharge Cervix:  Normal no lesions Uterus:  normal size, contour, position, consistency, mobility, non-tender Adnexa: ovaries:present,  normal adnexa in size, nontender and no masses   Pertinent ROS No burning with urination, frequency or urgency No nausea, vomiting or diarrhea Nor fever chills or other constitutional symptoms   Labs or studies No new    Impression Diagnoses this Encounter::   ICD-10-CM   1. Menometrorrhagia  N92.1 [id number] PELVIS TRANSVAGINAL NON-OB (TV ONLY)    US PELVIS (TRANSABDOMINAL ONLY)  2. Dysmenorrhea  N94.6 US PELVIS TRANSVAGINAL NON-OB (TV ONLY)    US PELVIS (TRANSABDOMINAL ONLY)  3. Dyspareunia, female  N94.10 US PELVIS TRANSVAGINAL NON-OB (TV ONLY)    US PELVIS (TRANSABDOMINAL ONLY)    Established relevant diagnosis(es):   Plan/Recommendations: Meds ordered this encounter  Medications  . megestrol (MEGACE) 40 MG tablet    Sig: 3 tablets a day for 5 days, 2 tablets a day for 5 days then 1 tablet daily    Dispense:  45 tablet    Refill:  3    Labs or Scans Ordered: Orders Placed This Encounter  Procedures  . US PELVIS TRANSVAGINAL NON-OB (TV ONLY)  . US PELVIS (TRANSABDOMINAL ONLY)    Management:: Megestrol algorithm Sonogram  Follow up with plan going forward  Follow up Return in about 6 weeks (around 09/23/2020) for GYN sono, Follow up, with Dr 09/25/2020.       All questions were answered.

## 2020-09-29 ENCOUNTER — Other Ambulatory Visit: Payer: Self-pay

## 2020-09-29 ENCOUNTER — Ambulatory Visit (INDEPENDENT_AMBULATORY_CARE_PROVIDER_SITE_OTHER): Payer: No Typology Code available for payment source | Admitting: Obstetrics & Gynecology

## 2020-09-29 ENCOUNTER — Ambulatory Visit (INDEPENDENT_AMBULATORY_CARE_PROVIDER_SITE_OTHER): Payer: No Typology Code available for payment source

## 2020-09-29 ENCOUNTER — Other Ambulatory Visit: Payer: No Typology Code available for payment source

## 2020-09-29 ENCOUNTER — Encounter: Payer: Self-pay | Admitting: Obstetrics & Gynecology

## 2020-09-29 VITALS — BP 104/64 | HR 85 | Ht 63.0 in | Wt 175.0 lb

## 2020-09-29 DIAGNOSIS — N941 Unspecified dyspareunia: Secondary | ICD-10-CM

## 2020-09-29 DIAGNOSIS — N921 Excessive and frequent menstruation with irregular cycle: Secondary | ICD-10-CM | POA: Diagnosis not present

## 2020-09-29 DIAGNOSIS — N946 Dysmenorrhea, unspecified: Secondary | ICD-10-CM

## 2020-09-29 NOTE — Progress Notes (Signed)
Follow up appointment for results  Chief Complaint  Patient presents with  . Follow-up    Discuss [place]    Blood pressure 104/64, pulse 85, height 5\' 3"  (1.6 m), weight 175 lb (79.4 kg).  PELVIS TRANSVAGINAL NON-OB (TV ONLY)  Result Date: 09/29/2020  [facility] [address] [place]                                                                          GYNECOLOGIC SONOGRAM Bonnie Aguilar is a 45 y.o. 54 LMP [id number] is here for a pelvic sonogram for menometrorrhagia. Uterus                      8.9 x 5.4 x 7.2 cm, Total uterine volume 180 cc, heterogeneous retroverted uterus Endometrium          11.2 mm, symmetrical, wnl Right ovary             3.3 x 2.1 x 2.3 cm, wnl Left ovary                3 x 2.5 x 3.6 cm, wnl No free fluid Technician Comments: PELVIC Janey Greaser TA/TV: heterogeneous retroverted uterus,EEC 11.2 mm,normal ovaries,ovaries appear mobile,no free fluid,left adnexal discomfort during ultrasound Chaperone: [place] 09/29/2020 11:18 AM Clinical Impression and recommendations: I have reviewed the sonogram results above, combined with the patient's current clinical course, below are my impressions and any appropriate recommendations for management based on the sonographic findings. Uterus is normal size shape and contour Endometrium consistent with megestrol therapy Both ovaries are normal size shape and morphology 10/01/2020 09/29/2020 11:42 AM  10/01/2020 PELVIS (TRANSABDOMINAL ONLY)  Result Date: 09/29/2020  [facility] [address] [place]                                                                          GYNECOLOGIC SONOGRAM Bonnie Aguilar is a 45 y.o. 54 LMP [id number] is here for a pelvic sonogram for menometrorrhagia. Uterus                      8.9 x 5.4 x 7.2 cm, Total uterine volume 180 cc, heterogeneous retroverted uterus Endometrium          11.2 mm,  symmetrical, wnl Right ovary             3.3 x 2.1 x 2.3 cm, wnl Left ovary                3 x 2.5 x 3.6 cm, wnl No free fluid Technician Comments: PELVIC Janey Greaser TA/TV: heterogeneous retroverted uterus,EEC 11.2 mm,normal ovaries,ovaries appear mobile,no free fluid,left adnexal discomfort during ultrasound Chaperone: [place] 09/29/2020 11:18 AM Clinical Impression and recommendations: I have reviewed the sonogram results above, combined with the patient's current clinical course,  below are my impressions and any appropriate recommendations for management based on the sonographic findings. Uterus is normal size shape and contour Endometrium consistent with megestrol therapy Both ovaries are normal size shape and morphology Bonnie Aguilar 09/29/2020 11:42 AM   Pt has had a very positive response to the megestrol She has not bled at all She states it is the first time in about 11 years where she is not been afraid to go out outside the home Her sonogram today is normal please see the results above  Dawson and I discussed the options Which would include continuing on the megestrol and trying to cycle it, endometrial ablation, and or vaginal hysterectomy  For now she wants to be as conservative as possible She understands this may not help her dyspareunia at all But she seems to be happy that it will positively impact her bleeding and pain  With that said he can try cyclical megestrol for now and of course we can proceed with ablation and/or vaginal hysterectomy in the future if needed for bleeding and symptom control and management   MEDS ordered this encounter: No orders of the defined types were placed in this encounter.   Orders for this encounter: No orders of the defined types were placed in this encounter.   Impression:   ICD-10-CM   1. Menometrorrhagia  N92.1   2. Dysmenorrhea  N94.6   3. Dyspareunia, female  N94.10      Plan: As above options discussed Sonogram reviewed  thoroughly and report done  Bonnie Aguilar will stop her megestrol when she finishes this prescription and mimic birth control pills She will stop it for 7 days and then restart her next prescription  If she does pretty well on that we probably will do 21 days of 40 mg of megestrol alone and 7 days off and see how cycling manages her bleeding  Of course other option would be a Mirena IUD we can discuss that once she is cycling appropriately and her symptoms are fairly well managed with the megestrol I will not use Mirena until I ascertain if her symptoms and cycle are controlled on the megestrol  Bonnie Aguilar will send me a MyChart message regarding her withdrawal menstrual period and tell me how it was and we will go from there  Follow Up: Return if symptoms worsen or fail to improve, for send mychart message with withdrawal report.       All questions were answered.  History reviewed. No pertinent past medical history.  Past Surgical History:  Procedure Laterality Date  . INCISION AND DRAINAGE ABSCESS Right 05/25/2018   Procedure: INCISION AND DRAINAGE ABSCESS (surgical incision right ankle);  Surgeon: Vickki Hearing, MD;  Location: [facility];  Service: Orthopedics;  Laterality: Right;  . ORIF ANKLE FRACTURE Right 03/23/2018   Procedure: OPEN REDUCTION INTERNAL FIXATION (ORIF) RIGHT ANKLE FRACTURE;  Surgeon: Vickki Hearing, MD;  Location: [facility];  Service: Orthopedics;  Laterality: Right;  . TOOTH EXTRACTION      OB History    Gravida  3   Para  2   Term  2   Preterm  0   AB  1   Living  2     SAB      IAB      Ectopic      Multiple      Live Births              No Known Allergies  Social History  Socioeconomic History  . Marital status: Married    Spouse name: Not on file  . Number of children: Not on file  . Years of education: Not on file  . Highest education level: Not on file  Occupational History  . Not on file  Tobacco Use  . Smoking  status: Current Every Day Smoker    Packs/day: 0.50    Years: 25.00    Pack years: 12.50    Types: Cigarettes  . Smokeless tobacco: Never Used  Vaping Use  . Vaping Use: Never used  Substance and Sexual Activity  . Alcohol use: Yes    Comment: couple times a week  . Drug use: Not Currently  . Sexual activity: Yes    Birth control/protection: None  Other Topics Concern  . Not on file  Social History Narrative  . Not on file   Social Determinants of Health   Financial Resource Strain: Low Risk   . Difficulty of Paying Living Expenses: Not very hard  Food Insecurity: Food Insecurity Present  . Worried About Programme researcher, broadcasting/film/video in the Last Year: Sometimes true  . Ran Out of Food in the Last Year: Sometimes true  Transportation Needs: No Transportation Needs  . Lack of Transportation (Medical): No  . Lack of Transportation (Non-Medical): No  Physical Activity: Insufficiently Active  . Days of Exercise per Week: 3 days  . Minutes of Exercise per Session: 30 min  Stress: No Stress Concern Present  . Feeling of Stress : Not at all  Social Connections: Moderately Isolated  . Frequency of Communication with Friends and Family: More than three times a week  . Frequency of Social Gatherings with Friends and Family: More than three times a week  . Attends Religious Services: Never  . Active Member of Clubs or Organizations: No  . Attends Banker Meetings: Never  . Marital Status: Married    Family History  Problem Relation Age of Onset  . Cancer Mother   . Epilepsy Father   . Cancer Maternal Grandmother

## 2020-09-29 NOTE — Progress Notes (Signed)
PELVIC US TA/TV: heterogeneous retroverted uterus,EEC 11.2 mm,normal ovaries,ovaries appear mobile,no free fluid,left adnexal discomfort during ultrasound  Chaperone: Clinical cytogeneticist

## 2021-01-01 ENCOUNTER — Telehealth: Payer: Self-pay | Admitting: Obstetrics & Gynecology

## 2021-01-01 MED ORDER — MEGESTROL ACETATE 40 MG PO TABS: ORAL_TABLET | ORAL | 3 refills | Status: DC

## 2021-01-01 NOTE — Telephone Encounter (Signed)
Left message that I refilled her megace

## 2021-01-01 NOTE — Telephone Encounter (Signed)
Pt states meds are working  Needs refill on Megestol  Please advise & notify pt     [employer]

## 2021-06-02 ENCOUNTER — Encounter: Payer: Self-pay | Admitting: *Deleted

## 2021-06-02 MED ORDER — MEGESTROL ACETATE 40 MG PO TABS: ORAL_TABLET | ORAL | 3 refills | Status: DC

## 2021-09-18 ENCOUNTER — Other Ambulatory Visit: Payer: Self-pay | Admitting: Adult Health

## 2021-11-19 ENCOUNTER — Other Ambulatory Visit: Payer: Self-pay | Admitting: Obstetrics & Gynecology

## 2021-11-27 ENCOUNTER — Other Ambulatory Visit: Payer: Self-pay | Admitting: *Deleted

## 2021-11-27 MED ORDER — MEGESTROL ACETATE 40 MG PO TABS: ORAL_TABLET | ORAL | 3 refills | Status: DC

## 2022-05-24 ENCOUNTER — Other Ambulatory Visit: Payer: Self-pay | Admitting: Obstetrics & Gynecology

## 2022-05-26 ENCOUNTER — Other Ambulatory Visit: Payer: Self-pay | Admitting: *Deleted

## 2022-05-26 MED ORDER — MEGESTROL ACETATE 40 MG PO TABS: ORAL_TABLET | ORAL | 3 refills | Status: DC

## 2022-12-07 ENCOUNTER — Other Ambulatory Visit: Payer: Self-pay | Admitting: Obstetrics & Gynecology

## 2023-08-30 ENCOUNTER — Encounter: Payer: Self-pay | Admitting: *Deleted
